# Patient Record
Sex: Female | Born: 1990 | Race: White | Hispanic: No | Marital: Married | Smoking: Never smoker
Health system: Southern US, Community
[De-identification: ages and names within clinical notes are randomized; demographics above are authoritative.]

## PROBLEM LIST (undated history)

## (undated) ENCOUNTER — Inpatient Hospital Stay (HOSPITAL_COMMUNITY): Payer: Self-pay

## (undated) ENCOUNTER — Emergency Department (HOSPITAL_BASED_OUTPATIENT_CLINIC_OR_DEPARTMENT_OTHER): Admission: EM | Payer: Medicaid Other

## (undated) DIAGNOSIS — F32A Depression, unspecified: Secondary | ICD-10-CM

## (undated) DIAGNOSIS — J40 Bronchitis, not specified as acute or chronic: Secondary | ICD-10-CM

## (undated) DIAGNOSIS — R519 Headache, unspecified: Secondary | ICD-10-CM

## (undated) DIAGNOSIS — J45909 Unspecified asthma, uncomplicated: Secondary | ICD-10-CM

## (undated) DIAGNOSIS — F329 Major depressive disorder, single episode, unspecified: Secondary | ICD-10-CM

## (undated) DIAGNOSIS — R51 Headache: Secondary | ICD-10-CM

## (undated) DIAGNOSIS — Z86718 Personal history of other venous thrombosis and embolism: Secondary | ICD-10-CM

## (undated) DIAGNOSIS — E162 Hypoglycemia, unspecified: Secondary | ICD-10-CM

## (undated) HISTORY — PX: CHOLECYSTECTOMY: SHX55

## (undated) HISTORY — DX: Personal history of other venous thrombosis and embolism: Z86.718

---
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
## 2012-07-04 ENCOUNTER — Encounter (HOSPITAL_COMMUNITY): Payer: Self-pay | Admitting: *Deleted

## 2012-07-04 ENCOUNTER — Emergency Department (HOSPITAL_COMMUNITY)
Admission: EM | Admit: 2012-07-04 | Discharge: 2012-07-04 | Disposition: A | Discharge status: Home Health | Payer: Self-pay | Attending: Emergency Medicine | Admitting: Emergency Medicine

## 2012-07-04 DIAGNOSIS — M79609 Pain in unspecified limb: Secondary | ICD-10-CM

## 2012-07-04 DIAGNOSIS — M7989 Other specified soft tissue disorders: Secondary | ICD-10-CM

## 2012-07-04 HISTORY — DX: Hypoglycemia, unspecified: E16.2

## 2012-07-04 MED ORDER — NAPROXEN 500 MG PO TABS: 500.0000 mg | ORAL_TABLET | Freq: Two times a day (BID) | ORAL | Status: AC

## 2012-07-04 NOTE — Progress Notes (Signed)
VASCULAR LAB PRELIMINARY  PRELIMINARY  PRELIMINARY  PRELIMINARY  Right upper extremity venous duplex completed.    Preliminary report: No evidence of deep or superficial vein thrombosis of the right upper extremity from the shoulder distally.  Becky Gomez, 07/04/2012, 5:45 PM

## 2012-07-04 NOTE — ED Notes (Addendum)
To ED for right arm pain and swelling since attempting to donate plasma. States there was a problem with the flow. Staff at donation center had to readjust the needle which caused infiltration and swelling. This was on Friday evening. Cap refill wnl

## 2012-07-04 NOTE — ED Provider Notes (Signed)
History  This chart was scribed for Becky Jakes, MD by Shari Heritage. The patient was seen in room TR06C/TR06C. Patient's care was started at 1309.     CSN: [id number]  Arrival date & time 07/04/12  1309   First MD Initiated Contact with Patient 07/04/12 [phone]      Chief Complaint  Patient presents with  . Arm Swelling    (Consider location/radiation/quality/duration/timing/severity/associated sxs/prior treatment) Patient is a 21 y.o. female presenting with extremity pain.  Extremity Pain This is a new problem. The current episode started more than 2 days ago. The problem occurs constantly. The problem has not changed since onset.Pertinent negatives include no chest pain, no abdominal pain, no headaches and no shortness of breath.    Becky Gomez is a 21 y.o. female who presents to the Emergency Department complaining of mild to moderate pain and swelling of the right upper extremity onset Friday evening (4 days ago) after patient tried to give a plasma donation. Patient says that the process was ultimately unsuccessful due to infiltration of blood into soft tissues and swelling. Patient denies other symptoms at this time. Patient has a medical history of hypoglycemia. She has never smoked.  Past Medical History  Diagnosis Date  . Hypoglycemia     History reviewed. No pertinent past surgical history.  No family history on file.  History  Substance Use Topics  . Smoking status: Never Smoker   . Smokeless tobacco: Not on file  . Alcohol Use: Yes    OB History    Grav Para Term Preterm Abortions TAB SAB Ect Mult Living                  Review of Systems  Respiratory: Negative for shortness of breath.   Cardiovascular: Negative for chest pain.  Gastrointestinal: Negative for abdominal pain.  Musculoskeletal:       Positive for right arm swelling, right arm pain.  Neurological: Negative for headaches.    Allergies  Penicillins  Home Medications   Current  Outpatient Rx  Name Route Sig Dispense Refill  . NUVARING VA Vaginal Place 1 Units vaginally every 28 (twenty-eight) days.    Marland Kitchen NAPROXEN 500 MG PO TABS Oral Take 1 tablet (500 mg total) by mouth 2 (two) times daily. 14 tablet 0    BP 111/68  Pulse 86  Temp 98 F (36.7 C) (Oral)  Resp 16  SpO2 98%  Physical Exam  Constitutional: She is oriented to person, place, and time. She appears well-developed and well-nourished.  HENT:  Head: Normocephalic and atraumatic.  Cardiovascular: Normal rate and regular rhythm.   No murmur heard. Pulses:      Radial pulses are 1+ on the right side.       Right radial pulse is 1+.  Pulmonary/Chest: Effort normal and breath sounds normal.  Abdominal: Soft. Bowel sounds are normal. There is no tenderness.  Musculoskeletal:       Linear 5 cm bruise to right antecubital area.  Neurological: She is alert and oriented to person, place, and time.  Skin:       Cap refill is 1 sec in right fingers.  Psychiatric: She has a normal mood and affect. Her behavior is normal.    ED Course  Procedures (including critical care time) DIAGNOSTIC STUDIES: Oxygen Saturation is 94% on room air, adequate by my interpretation.    COORDINATION OF CARE: 3:23pm- Patient informed of current plan for treatment and evaluation and agrees with plan at  this time.   Doppler study of the right upper extremity without evidence of a deep vein thrombosis.  Labs Reviewed - No data to display No results found.   1. Arm swelling       MDM  Doppler study of the right upper trimming without evidence of a DVT. Swelling is most likely due to to infiltration of of red blood cells or plasma during the plasma donation process. No evidence of infection. Patient will continue to elevate her right arm and would treat with anti-inflammatories. Patient will return for any new or worse symptoms.      I personally performed the services described in this documentation, which was  scribed in my presence. The recorded information has been reviewed and considered.     Becky Jakes, MD 07/04/12 [phone]

## 2014-10-02 ENCOUNTER — Other Ambulatory Visit: Payer: Self-pay | Admitting: Family Medicine

## 2014-10-02 DIAGNOSIS — R1013 Epigastric pain: Secondary | ICD-10-CM

## 2014-10-09 ENCOUNTER — Other Ambulatory Visit: Payer: Self-pay | Admitting: Family Medicine

## 2014-10-09 ENCOUNTER — Ambulatory Visit
Admission: RE | Admit: 2014-10-09 | Discharge: 2014-10-09 | Disposition: A | Discharge status: Home / Self Care | Payer: BC Managed Care – PPO | Source: Ambulatory Visit | Attending: Family Medicine | Admitting: Family Medicine

## 2014-10-09 DIAGNOSIS — R1013 Epigastric pain: Secondary | ICD-10-CM

## 2017-01-14 ENCOUNTER — Other Ambulatory Visit: Payer: Self-pay | Admitting: Physician Assistant

## 2017-01-14 DIAGNOSIS — R109 Unspecified abdominal pain: Secondary | ICD-10-CM

## 2017-01-21 ENCOUNTER — Emergency Department (HOSPITAL_COMMUNITY)
Admission: EM | Admit: 2017-01-21 | Discharge: 2017-01-21 | Disposition: A | Discharge status: Home / Self Care | Payer: 59 | Attending: Emergency Medicine | Admitting: Emergency Medicine

## 2017-01-21 ENCOUNTER — Encounter (HOSPITAL_COMMUNITY): Payer: Self-pay | Admitting: Emergency Medicine

## 2017-01-21 DIAGNOSIS — Z3A08 8 weeks gestation of pregnancy: Secondary | ICD-10-CM | POA: Insufficient documentation

## 2017-01-21 DIAGNOSIS — O99511 Diseases of the respiratory system complicating pregnancy, first trimester: Secondary | ICD-10-CM | POA: Insufficient documentation

## 2017-01-21 DIAGNOSIS — J069 Acute upper respiratory infection, unspecified: Secondary | ICD-10-CM | POA: Insufficient documentation

## 2017-01-21 DIAGNOSIS — J40 Bronchitis, not specified as acute or chronic: Secondary | ICD-10-CM | POA: Diagnosis not present

## 2017-01-21 DIAGNOSIS — Z3A01 Less than 8 weeks gestation of pregnancy: Secondary | ICD-10-CM

## 2017-01-21 MED ORDER — IPRATROPIUM BROMIDE 0.02 % IN SOLN
0.5000 mg | Freq: Once | RESPIRATORY_TRACT | Status: AC
  Administered 2017-01-21: 0.5 mg via RESPIRATORY_TRACT
  Filled 2017-01-21: qty 2.5

## 2017-01-21 MED ORDER — ALBUTEROL SULFATE HFA 108 (90 BASE) MCG/ACT IN AERS
2.0000 | INHALATION_SPRAY | RESPIRATORY_TRACT | Status: DC | PRN
  Administered 2017-01-21: 2 via RESPIRATORY_TRACT
  Filled 2017-01-21: qty 6.7

## 2017-01-21 MED ORDER — ALBUTEROL SULFATE (2.5 MG/3ML) 0.083% IN NEBU
5.0000 mg | INHALATION_SOLUTION | Freq: Once | RESPIRATORY_TRACT | Status: AC
  Administered 2017-01-21: 5 mg via RESPIRATORY_TRACT
  Filled 2017-01-21: qty 6

## 2017-01-21 MED ORDER — AEROCHAMBER Z-STAT PLUS/MEDIUM MISC
1.0000 | Freq: Once | Status: AC
  Administered 2017-01-21: 1
  Filled 2017-01-21: qty 1

## 2017-01-21 NOTE — ED Triage Notes (Signed)
Patient is [redacted] week pregnant. Patient started was feeling yucky, congested that started yesterday. Patient states last she was wheezing last night and could not get comfortable. Patient states she is not feeling any better today. She has been sneezing since last Friday.

## 2017-01-21 NOTE — Discharge Instructions (Signed)
Use inhaler 2 puffs every 3-4 hours as needed for cough or trouble breathing.  Use Tylenol as needed for fever.  You can consider stopping the Tamiflu as it does cause nausea, abdominal cramping and you did not appear to have influenza at this time.

## 2017-01-21 NOTE — ED Provider Notes (Addendum)
[facility]-EMERGENCY DEPT Provider Note   CSN: [id number] Arrival date & time: 01/21/17  1951     History   Chief Complaint Chief Complaint  Patient presents with  . Nasal Congestion  . sneezing  . Nausea  . Headache  . Cough    HPI Becky Gomez is a 26 y.o. female.  She presents for evaluation of wheezing, with nonproductive cough, present for 2 days.  She called her PCP today and they phoned in a prescription for Tamiflu, she has taken a single dose.  He denies fever, vomiting, weakness or dizziness.  She is reported to be 3 months pregnant.  She has ongoing nausea associated with an early pregnancy.  She does not have chronic asthma symptoms, but has had bronchitis in the past.  She is not a smoker.  There are no other known modifying factors.  HPI  Past Medical History:  Diagnosis Date  . Hypoglycemia     There are no active problems to display for this patient.   Past Surgical History:  Procedure Laterality Date  . CHOLECYSTECTOMY      OB History    Gravida Para Term Preterm AB Living   2 1 1          SAB TAB Ectopic Multiple Live Births                   Home Medications    Prior to Admission medications   Medication Sig Start Date End Date Taking? Authorizing Provider  oseltamivir (TAMIFLU) 75 MG capsule Take 75 mg by mouth 2 (two) times daily.   Yes Historical Provider, MD  Etonogestrel-Ethinyl Estradiol (NUVARING VA) Place 1 Units vaginally every 28 (twenty-eight) days.    Historical Provider, MD    Family History History reviewed. No pertinent family history.  Social History Social History  Substance Use Topics  . Smoking status: Never Smoker  . Smokeless tobacco: Never Used  . Alcohol use No     Allergies   Penicillins   Review of Systems Review of Systems   Physical Exam Updated Vital Signs BP 114/87 (BP Location: Left Arm)   Pulse (!) 122   Temp 98.1 F (36.7 C) (Oral)   Resp 18   Ht 5\' 6"  (1.676 m)   Wt 217 lb (98.4 kg)    SpO2 97%   BMI 35.02 kg/m   Physical Exam  Constitutional: She is oriented to person, place, and time. She appears well-developed. No distress.  Overweight  HENT:  Head: Normocephalic and atraumatic.  Eyes: Conjunctivae and EOM are normal. Pupils are equal, round, and reactive to light.  Neck: Normal range of motion and phonation normal. Neck supple.  Cardiovascular: Normal rate and regular rhythm.   Pulmonary/Chest: Effort normal. No respiratory distress. She has wheezes. She exhibits no tenderness.  Decreased expiratory air movement bilaterally with generalized wheezing.  No rales or rhonchi.  Abdominal: Soft. She exhibits no distension. There is no tenderness. There is no guarding.  Musculoskeletal: Normal range of motion.  Neurological: She is alert and oriented to person, place, and time. She exhibits normal muscle tone.  Skin: Skin is warm and dry.  Psychiatric: She has a normal mood and affect. Her behavior is normal. Judgment and thought content normal.  Nursing note and vitals reviewed.    ED Treatments / Results  Labs (all labs ordered are listed, but only abnormal results are displayed) Labs Reviewed - No data to display  EKG  EKG Interpretation None  Radiology No results found.  Procedures Procedures (including critical care time)  Medications Ordered in ED Medications  albuterol (PROVENTIL HFA;VENTOLIN HFA) 108 (90 Base) MCG/ACT inhaler 2 puff (not administered)  aerochamber Z-Stat Plus/medium 1 each (not administered)  albuterol (PROVENTIL) (2.5 MG/3ML) 0.083% nebulizer solution 5 mg (5 mg Nebulization Given 01/21/17 2158)  ipratropium (ATROVENT) nebulizer solution 0.5 mg (0.5 mg Nebulization Given 01/21/17 2158)     Initial Impression / Assessment and Plan / ED Course  I have reviewed the triage vital signs and the nursing notes.  Pertinent labs & imaging results that were available during my care of the patient were reviewed by me and  considered in my medical decision making (see chart for details).     Medications  albuterol (PROVENTIL HFA;VENTOLIN HFA) 108 (90 Base) MCG/ACT inhaler 2 puff (not administered)  aerochamber Z-Stat Plus/medium 1 each (not administered)  albuterol (PROVENTIL) (2.5 MG/3ML) 0.083% nebulizer solution 5 mg (5 mg Nebulization Given 01/21/17 2158)  ipratropium (ATROVENT) nebulizer solution 0.5 mg (0.5 mg Nebulization Given 01/21/17 2158)    Patient Vitals for the past 24 hrs:  BP Temp Temp src Pulse Resp SpO2 Height Weight  01/21/17 2158 - - - - - 97 % - -  01/21/17 1952 114/87 98.1 F (36.7 C) Oral (!) 122 18 96 % 5\' 6"  (1.676 m) 217 lb (98.4 kg)    11:26 PM Reevaluation with update and discussion. After initial assessment and treatment, an updated evaluation reveals patient states she feels better and notices less wheezing.  Lungs have improved air movement with very few scattered wheezes.  Findings discussed with patient and husband, all questions answered. Becky Gomez L    Final Clinical Impressions(s) / ED Diagnoses   Final diagnoses:  Bronchitis  Viral upper respiratory tract infection  Less than [redacted] weeks gestation of pregnancy    Evaluation is consistent with bronchitis likely viral mediated.  Doubt influenza, serious bacterial infection, pneumonia or metabolic instability.  Discussed continuing Tamiflu versus stopping because it does not appear that she has influenza.  Patient is not febrile.  Does not have any significant signs or symptoms of seasonal influenza.   Nursing Notes Reviewed/ Care Coordinated Applicable Imaging Reviewed Interpretation of Laboratory Data incorporated into ED treatment  The patient appears reasonably screened and/or stabilized for discharge and I doubt any other medical condition or other [employer] requiring further screening, evaluation, or treatment in the ED at this time prior to discharge.  Plan: Home Medications-continue usual, albuterol inhaler with  spacer to use every 4 as needed; Home Treatments-rest, fluids; return here if the recommended treatment, does not improve the symptoms; Recommended follow up-PCP, as needed    New Prescriptions New Prescriptions   No medications on file     Mancel BaleElliott Mckenleigh Tarlton, MD 01/21/17 [id number]    Mancel BaleElliott Mizraim Harmening, MD 01/21/17 [phone]

## 2018-12-01 ENCOUNTER — Encounter (HOSPITAL_COMMUNITY): Payer: Self-pay | Admitting: *Deleted

## 2018-12-01 ENCOUNTER — Inpatient Hospital Stay (HOSPITAL_COMMUNITY)
Admission: AD | Admit: 2018-12-01 | Discharge: 2018-12-02 | Disposition: A | Discharge status: Home / Self Care | Payer: Medicaid Other | Source: Ambulatory Visit | Attending: Obstetrics and Gynecology | Admitting: Obstetrics and Gynecology

## 2018-12-01 DIAGNOSIS — J029 Acute pharyngitis, unspecified: Secondary | ICD-10-CM

## 2018-12-01 DIAGNOSIS — J069 Acute upper respiratory infection, unspecified: Secondary | ICD-10-CM | POA: Diagnosis not present

## 2018-12-01 DIAGNOSIS — Z3A13 13 weeks gestation of pregnancy: Secondary | ICD-10-CM

## 2018-12-01 DIAGNOSIS — R102 Pelvic and perineal pain: Secondary | ICD-10-CM

## 2018-12-01 DIAGNOSIS — R109 Unspecified abdominal pain: Secondary | ICD-10-CM | POA: Diagnosis present

## 2018-12-01 DIAGNOSIS — O26891 Other specified pregnancy related conditions, first trimester: Secondary | ICD-10-CM

## 2018-12-01 DIAGNOSIS — Z880 Allergy status to penicillin: Secondary | ICD-10-CM | POA: Insufficient documentation

## 2018-12-01 DIAGNOSIS — O99511 Diseases of the respiratory system complicating pregnancy, first trimester: Secondary | ICD-10-CM | POA: Insufficient documentation

## 2018-12-01 DIAGNOSIS — O26899 Other specified pregnancy related conditions, unspecified trimester: Secondary | ICD-10-CM

## 2018-12-01 HISTORY — DX: Depression, unspecified: F32.A

## 2018-12-01 HISTORY — DX: Unspecified asthma, uncomplicated: J45.909

## 2018-12-01 HISTORY — DX: Major depressive disorder, single episode, unspecified: F32.9

## 2018-12-01 HISTORY — DX: Bronchitis, not specified as acute or chronic: J40

## 2018-12-01 HISTORY — DX: Headache: R51

## 2018-12-01 HISTORY — DX: Headache, unspecified: R51.9

## 2018-12-01 LAB — URINALYSIS, ROUTINE W REFLEX MICROSCOPIC
Bilirubin Urine: NEGATIVE
Glucose, UA: NEGATIVE mg/dL
Hgb urine dipstick: NEGATIVE
Ketones, ur: NEGATIVE mg/dL
Nitrite: NEGATIVE
Protein, ur: NEGATIVE mg/dL
Specific Gravity, Urine: 1.02 (ref 1.005–1.030)
pH: 6 (ref 5.0–8.0)

## 2018-12-01 LAB — INFLUENZA PANEL BY PCR (TYPE A & B)
Influenza A By PCR: NEGATIVE
Influenza B By PCR: NEGATIVE

## 2018-12-01 MED ORDER — ACETAMINOPHEN 325 MG PO TABS
650.0000 mg | ORAL_TABLET | Freq: Once | ORAL | Status: AC
  Administered 2018-12-01: 650 mg via ORAL
  Filled 2018-12-01: qty 2

## 2018-12-01 NOTE — MAU Provider Note (Signed)
History     CSN: [id number]  Arrival date and time: 12/01/18 2139   First Provider Initiated Contact with Patient 12/01/18 2240      Chief Complaint  Patient presents with  . Abdominal Pain  . Cough   Becky Gomez is a 28 y.o. G5P2 at 7410w1d who presents to MAU with complaints of cough, intermittent fever and abdominal pain. She reports cough and sore throat has been occurring for the past week, symptoms are associated with mucous and occasional wheezing after coughing for an extended period of time. She reports both children were diagnosed with strep throat and the flu last week, was given Tamiflu to prevent her from getting the flu- completed course this past Sunday. Denies currently having a fever or wheezing, has not taken any medication for cough or possible flu other than completing Tamiflu. She reports abdominal pain that started yesterday, reports abdominal pain as sharp aching pain in lower abdomen. Reports no pain when laying down or not moving and pain starts with movement, standing, or changing position in bed. Rates pain 5/10- has not taken any medication for abdominal pain. Denies vaginal bleeding or discharge. [facility] [place], which she has had a normal IUP on US.    OB History    Gravida  5   Para  2   Term  2   Preterm      AB  2   Living  2     SAB  2   TAB      Ectopic      Multiple      Live Births  1           Past Medical History:  Diagnosis Date  . Asthma   . Bronchitis   . Depression   . Headache   . Hypoglycemia     Past Surgical History:  Procedure Laterality Date  . CHOLECYSTECTOMY      History reviewed. No pertinent family history.  Social History   Tobacco Use  . Smoking status: Never Smoker  . Smokeless tobacco: Never Used  Substance Use Topics  . Alcohol use: No  . Drug use: No    Allergies:  Allergies  Allergen Reactions  . Penicillins Nausea And Vomiting    Has patient  had a PCN reaction causing immediate rash, facial/tongue/throat swelling, SOB or lightheadedness with hypotension: no Has patient had a PCN reaction causing severe rash involving mucus membranes or skin necrosis: no Has patient had a PCN reaction that required hospitalization: no Has patient had a PCN reaction occurring within the last 10 years: no If all of the above answers are "NO", then may proceed with Cephalosporin use.     Medications Prior to Admission  Medication Sig Dispense Refill Last Dose  . Prenatal Vit-Fe Fumarate-FA (PRENATAL MULTIVITAMIN) TABS tablet Take 1 tablet by mouth daily at 12 noon.     . sertraline (ZOLOFT) 25 MG tablet Take 25 mg by mouth daily.   12/01/2018 at Unknown time  . Etonogestrel-Ethinyl Estradiol (NUVARING VA) Place 1 Units vaginally every 28 (twenty-eight) days.   07/04/2012 at Unknown  . oseltamivir (TAMIFLU) 75 MG capsule Take 75 mg by mouth 2 (two) times daily.   01/21/2017 at 1800    Review of Systems  Constitutional: Positive for fever. Negative for chills.       Intermittent fever  HENT: Positive for sore throat.   Respiratory: Positive for cough and wheezing. Negative for shortness of  breath.        No wheezing currently   Cardiovascular: Negative.   Gastrointestinal: Positive for abdominal pain. Negative for constipation, diarrhea, nausea and vomiting.  Genitourinary: Negative.   Musculoskeletal: Negative.    Physical Exam   Patient Vitals for the past 24 hrs:  BP Temp Pulse Resp SpO2 Height Weight  12/01/18 2310 (!) 104/58 98.3 F (36.8 C) 74 18 - - -  12/01/18 2152 113/64 - 89 - 99 % - -  12/01/18 2147 - 98.2 F (36.8 C) - 18 - 5\' 6"  (1.676 m) 96.2 kg   Physical Exam  Nursing note and vitals reviewed. Constitutional: She is oriented to person, place, and time. She appears well-developed and well-nourished. No distress.  Cardiovascular: Normal rate, regular rhythm and normal heart sounds.  Respiratory: Effort normal and breath sounds  normal. No respiratory distress. She has no wheezes. She has no rales.  GI: Soft. She exhibits no distension. There is abdominal tenderness. There is no rebound and no guarding.  Lower pelvis and left abdomen  Musculoskeletal: Normal range of motion.        General: No edema.  Neurological: She is alert and oriented to person, place, and time.  Psychiatric: She has a normal mood and affect. Her behavior is normal. Thought content normal.   FHR 155 by doppler   Dilation: Closed Effacement (%): Thick Cervical Position: Posterior Exam by:: Steward Drone CNM  MAU Course  Procedures  MDM Orders Placed This Encounter  Procedures  . Culture, OB Urine  . Group A Strep by PCR  . Urinalysis, Routine w reflex microscopic  . Influenza panel by PCR (type A & B)   Results for orders placed or performed during the hospital encounter of 12/01/18 (from the past 24 hour(s))  Influenza panel by PCR (type A & B)     Status: None   Collection Time: 12/01/18 10:00 PM  Result Value Ref Range   Influenza A By PCR NEGATIVE NEGATIVE   Influenza B By PCR NEGATIVE NEGATIVE  Group A Strep by PCR     Status: None   Collection Time: 12/01/18 10:00 PM  Result Value Ref Range   Group A Strep by PCR NOT DETECTED NOT DETECTED  Urinalysis, Routine w reflex microscopic     Status: Abnormal   Collection Time: 12/01/18 10:15 PM  Result Value Ref Range   Color, Urine YELLOW YELLOW   APPearance HAZY (A) CLEAR   Specific Gravity, Urine 1.020 1.005 - 1.030   pH 6.0 5.0 - 8.0   Glucose, UA NEGATIVE NEGATIVE mg/dL   Hgb urine dipstick NEGATIVE NEGATIVE   Bilirubin Urine NEGATIVE NEGATIVE   Ketones, ur NEGATIVE NEGATIVE mg/dL   Protein, ur NEGATIVE NEGATIVE mg/dL   Nitrite NEGATIVE NEGATIVE   Leukocytes, UA MODERATE (A) NEGATIVE   RBC / HPF 0-5 0 - 5 RBC/hpf   WBC, UA 0-5 0 - 5 WBC/hpf   Bacteria, UA RARE (A) NONE SEEN   Squamous Epithelial / LPF 0-5 0 - 5   Mucus PRESENT    Treatments in MAU included  Tylenol for abdominal pain. Patient reports pain is relieved with medication.   Discussed results with patient. Influenza negative, Strep pending- will call patient with positive results and manage accordingly. Educated and discussed safe medications to take during pregnancy for cough/congestion/sore throat. Discussed RLP and position changes/maernity support belt later in pregnancy. Discussed reasons to return to MAU. Follow up as scheduled for prenatal appointments. Pt stable at time  of discharge.    Assessment and Plan   1. URI with cough and congestion   2. Acute sore throat   3. [redacted] weeks gestation of pregnancy   4. Pain of round ligament during pregnancy    Discharge home Will call with results of pending labs if positive  Safe medications to take during pregnancy  Return to MAU as needed Follow up as scheduled for prenatal appointments  Maternity support belt later in pregnancy   Allergies as of 12/02/2018      Reactions   Penicillins Nausea And Vomiting   Has patient had a PCN reaction causing immediate rash, facial/tongue/throat swelling, SOB or lightheadedness with hypotension: no Has patient had a PCN reaction causing severe rash involving mucus membranes or skin necrosis: no Has patient had a PCN reaction that required hospitalization: no Has patient had a PCN reaction occurring within the last 10 years: no If all of the above answers are "NO", then may proceed with Cephalosporin use.      Medication List    STOP taking these medications   NUVARING VA   oseltamivir 75 MG capsule Commonly known as:  TAMIFLU     TAKE these medications   prenatal multivitamin Tabs tablet Take 1 tablet by mouth daily at 12 noon.   sertraline 25 MG tablet Commonly known as:  ZOLOFT Take 25 mg by mouth daily.       Sharyon Cable CNM 12/01/2018, 11:06 PM

## 2018-12-01 NOTE — MAU Note (Addendum)
"  Both of my kids have had flu and strep throat" Tonight I have just not felt well. Tonight had lower abd pain. Have sore throat and cough. Wheezing. Off and on temp over a wk. Sometimes coughs up mucous. Sometimes red specs in my urine when I pee

## 2018-12-02 LAB — GROUP A STREP BY PCR: Group A Strep by PCR: NOT DETECTED

## 2018-12-02 NOTE — Progress Notes (Signed)
Veronica Rogers CNM in earlier to discuss test results and d/c plan. Written and verbal d/c instructions given and understanding voiced. 

## 2018-12-03 LAB — CULTURE, OB URINE

## 2018-12-30 ENCOUNTER — Encounter (HOSPITAL_COMMUNITY): Payer: Self-pay

## 2018-12-30 ENCOUNTER — Other Ambulatory Visit: Payer: Self-pay

## 2018-12-30 ENCOUNTER — Inpatient Hospital Stay (HOSPITAL_BASED_OUTPATIENT_CLINIC_OR_DEPARTMENT_OTHER): Payer: Medicaid Other

## 2018-12-30 ENCOUNTER — Inpatient Hospital Stay (HOSPITAL_COMMUNITY)
Admission: AD | Admit: 2018-12-30 | Discharge: 2018-12-30 | Disposition: A | Discharge status: Home / Self Care | Payer: Medicaid Other | Source: Ambulatory Visit | Attending: Obstetrics and Gynecology | Admitting: Obstetrics and Gynecology

## 2018-12-30 DIAGNOSIS — O4692 Antepartum hemorrhage, unspecified, second trimester: Secondary | ICD-10-CM

## 2018-12-30 DIAGNOSIS — Z3A17 17 weeks gestation of pregnancy: Secondary | ICD-10-CM

## 2018-12-30 DIAGNOSIS — O209 Hemorrhage in early pregnancy, unspecified: Secondary | ICD-10-CM | POA: Diagnosis not present

## 2018-12-30 DIAGNOSIS — Z880 Allergy status to penicillin: Secondary | ICD-10-CM | POA: Insufficient documentation

## 2018-12-30 DIAGNOSIS — O9989 Other specified diseases and conditions complicating pregnancy, childbirth and the puerperium: Secondary | ICD-10-CM | POA: Diagnosis not present

## 2018-12-30 DIAGNOSIS — O4690 Antepartum hemorrhage, unspecified, unspecified trimester: Secondary | ICD-10-CM

## 2018-12-30 LAB — URINALYSIS, MICROSCOPIC (REFLEX)

## 2018-12-30 LAB — URINALYSIS, ROUTINE W REFLEX MICROSCOPIC
Bilirubin Urine: NEGATIVE
Glucose, UA: NEGATIVE mg/dL
Ketones, ur: 15 mg/dL — AB
Leukocytes, UA: NEGATIVE
Nitrite: NEGATIVE
Protein, ur: NEGATIVE mg/dL
SPECIFIC GRAVITY, URINE: 1.02 (ref 1.005–1.030)
pH: 6.5 (ref 5.0–8.0)

## 2018-12-30 NOTE — MAU Note (Signed)
Pt. States she noticed some moderate bleeding around 3pm today after having intercourse.  Pt states blood filled up toilet, but has "eased up" over the last hour.  Pt also reports some lower abd, pain that she rates 4/10.  FHR 150

## 2018-12-30 NOTE — MAU Provider Note (Signed)
History     CSN: [id number]  Arrival date and time: 12/30/18 1534   First Provider Initiated Contact with Patient 12/30/18 1634      Chief Complaint  Patient presents with  . Vaginal Bleeding  . Abdominal Pain   HPI Becky Gomez is a 28 y.o. [phone] at 7535w2d who presents with bleeding after intercourse. She states she noticed bleeding in the toilet after intercourse and felt like it was more than it should be. She denies any pain.   OB History    Gravida  5   Para  2   Term  2   Preterm      AB  2   Living  2     SAB  2   TAB      Ectopic      Multiple      Live Births  1           Past Medical History:  Diagnosis Date  . Asthma   . Bronchitis   . Depression   . Headache   . Hypoglycemia     Past Surgical History:  Procedure Laterality Date  . CHOLECYSTECTOMY      History reviewed. No pertinent family history.  Social History   Tobacco Use  . Smoking status: Never Smoker  . Smokeless tobacco: Never Used  Substance Use Topics  . Alcohol use: No  . Drug use: No    Allergies:  Allergies  Allergen Reactions  . Penicillins Nausea And Vomiting    Has patient had a PCN reaction causing immediate rash, facial/tongue/throat swelling, SOB or lightheadedness with hypotension: no Has patient had a PCN reaction causing severe rash involving mucus membranes or skin necrosis: no Has patient had a PCN reaction that required hospitalization: no Has patient had a PCN reaction occurring within the last 10 years: no If all of the above answers are "NO", then may proceed with Cephalosporin use.     Medications Prior to Admission  Medication Sig Dispense Refill Last Dose  . Prenatal Vit-Fe Fumarate-FA (PRENATAL MULTIVITAMIN) TABS tablet Take 1 tablet by mouth daily at 12 noon.     . sertraline (ZOLOFT) 25 MG tablet Take 25 mg by mouth daily.   12/01/2018 at Unknown time    Review of Systems  Constitutional: Negative.  Negative for fatigue and  fever.  HENT: Negative.   Respiratory: Negative.  Negative for shortness of breath.   Cardiovascular: Negative.  Negative for chest pain.  Gastrointestinal: Negative.  Negative for abdominal pain, constipation, diarrhea, nausea and vomiting.  Genitourinary: Positive for vaginal bleeding. Negative for dysuria.  Neurological: Negative.  Negative for dizziness and headaches.   Physical Exam   Blood pressure 105/62, pulse 83, temperature 98.5 F (36.9 C), temperature source Oral, resp. rate 18, SpO2 100 %.  Physical Exam  Nursing note and vitals reviewed. Constitutional: She is oriented to person, place, and time. She appears well-developed and well-nourished. No distress.  HENT:  Head: Normocephalic.  Eyes: Pupils are equal, round, and reactive to light.  Cardiovascular: Normal rate, regular rhythm and normal heart sounds.  Respiratory: Effort normal and breath sounds normal. No respiratory distress.  GI: Soft. Bowel sounds are normal. She exhibits no distension. There is no abdominal tenderness.  Genitourinary:    Genitourinary Comments: Small amount of watery pink discharge on speculum exam   Neurological: She is alert and oriented to person, place, and time.  Skin: Skin is warm and dry.  Psychiatric: She has  a normal mood and affect. Her behavior is normal. Judgment and thought content normal.    MAU Course  Procedures Results for orders placed or performed during the hospital encounter of 12/30/18 (from the past 24 hour(s))  Urinalysis, Routine w reflex microscopic     Status: Abnormal   Collection Time: 12/30/18  5:47 PM  Result Value Ref Range   Color, Urine YELLOW YELLOW   APPearance HAZY (A) CLEAR   Specific Gravity, Urine 1.020 1.005 - 1.030   pH 6.5 5.0 - 8.0   Glucose, UA NEGATIVE NEGATIVE mg/dL   Hgb urine dipstick SMALL (A) NEGATIVE   Bilirubin Urine NEGATIVE NEGATIVE   Ketones, ur 15 (A) NEGATIVE mg/dL   Protein, ur NEGATIVE NEGATIVE mg/dL   Nitrite NEGATIVE  NEGATIVE   Leukocytes, UA NEGATIVE NEGATIVE  Urinalysis, Microscopic (reflex)     Status: Abnormal   Collection Time: 12/30/18  5:47 PM  Result Value Ref Range   RBC / HPF 0-5 0 - 5 RBC/hpf   WBC, UA 6-10 0 - 5 WBC/hpf   Bacteria, UA MANY (A) NONE SEEN   Squamous Epithelial / LPF 0-5 0 - 5   MDM UA Fern-negative [place] MFM Limited- cervix 3.86cm, normal fluid, no abruption or previa  Assessment and Plan   1. Vaginal bleeding during pregnancy   2. [redacted] weeks gestation of pregnancy    -Discharge home in stable condition -Pelvic rest precautions discussed -Patient advised to follow-up with OB in [place] as scheduled for prenatal care -Patient may return to MAU as needed or if her condition were to change or worsen   Rolm Bookbinder CNM 12/30/2018, 4:34 PM

## 2018-12-30 NOTE — Discharge Instructions (Signed)

## 2019-08-30 ENCOUNTER — Encounter (HOSPITAL_COMMUNITY): Payer: Self-pay

## 2019-10-22 ENCOUNTER — Other Ambulatory Visit: Payer: Self-pay

## 2019-10-22 DIAGNOSIS — Z20822 Contact with and (suspected) exposure to covid-19: Secondary | ICD-10-CM

## 2019-10-24 LAB — NOVEL CORONAVIRUS, NAA: SARS-CoV-2, NAA: NOT DETECTED

## 2020-09-10 IMAGING — US US MFM OB LIMITED
1 series · 15 of 16 positions shown · non-contrast
Comparison: none

[Series 1: us mfm ob limited · 15 of 16 slices shown]
[im 1/16]
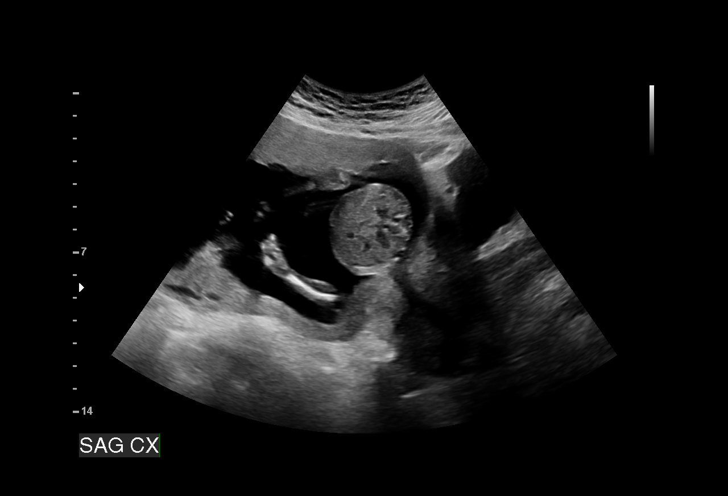
[im 2/16]
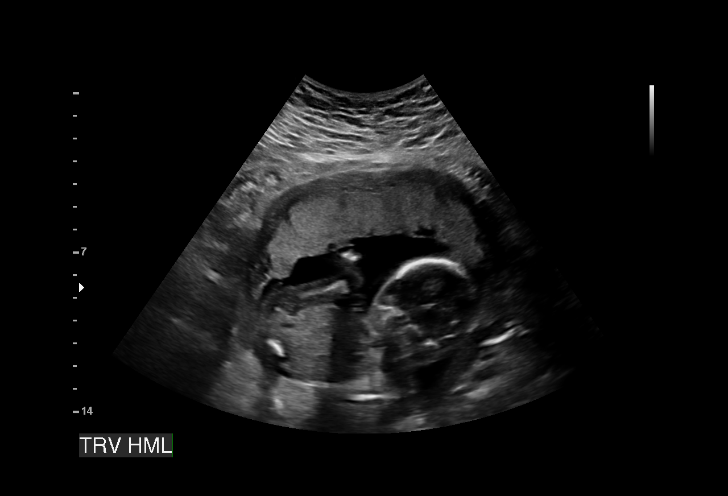
[im 3/16]
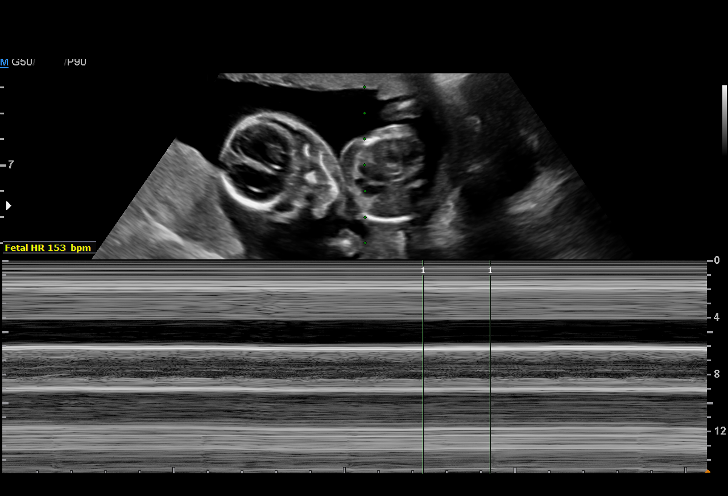
[im 4/16]
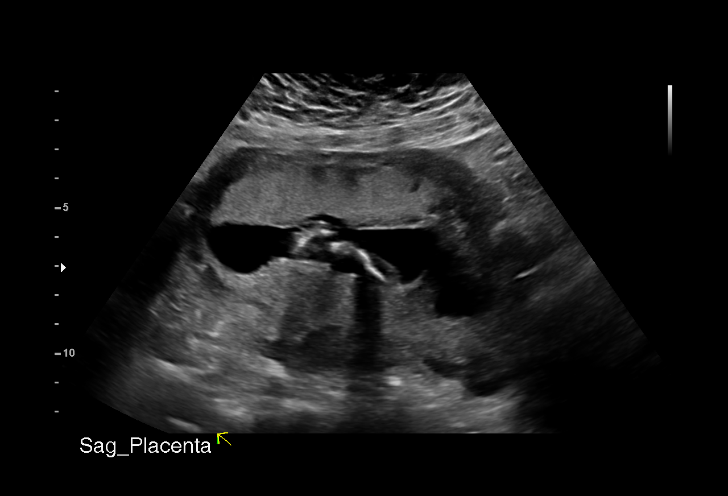
[im 5/16]
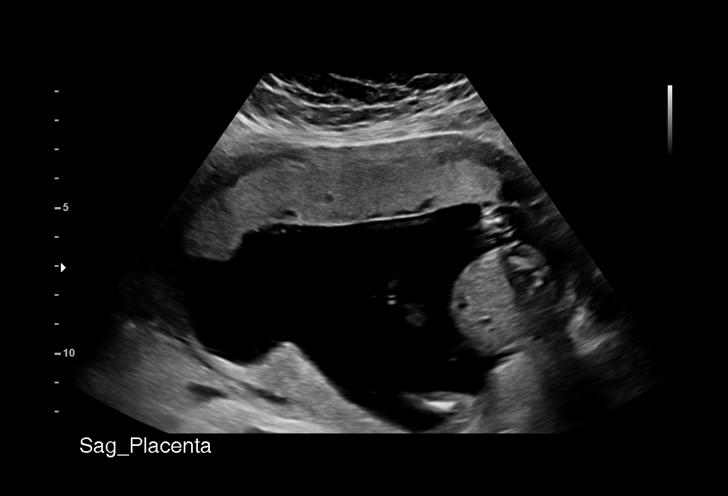
[im 6/16]
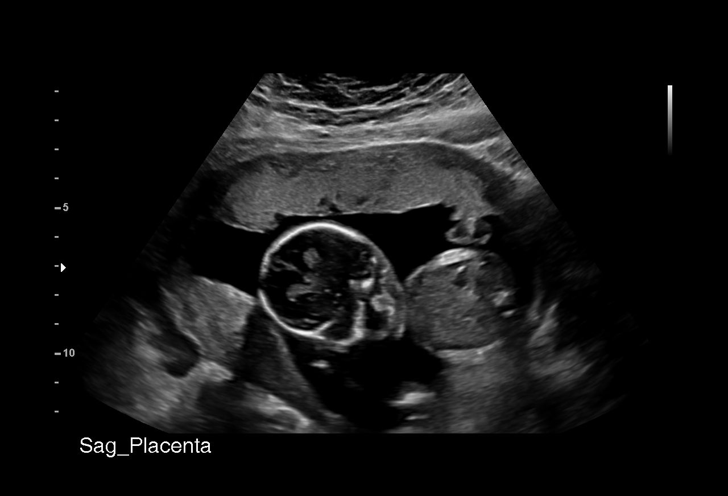
[im 7/16]
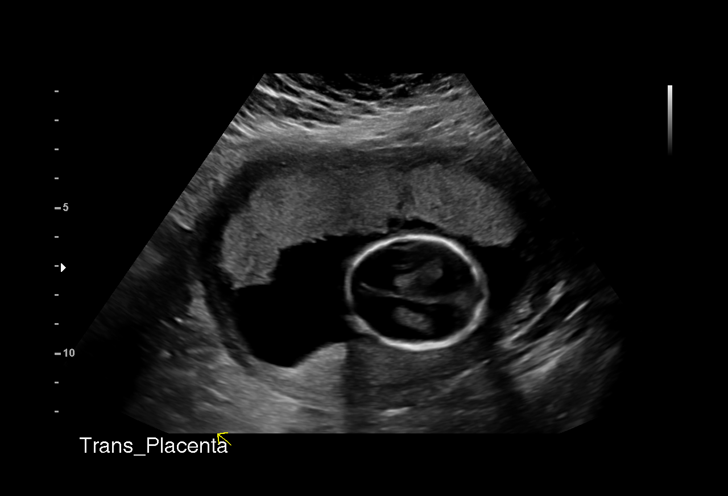
[im 9/16]
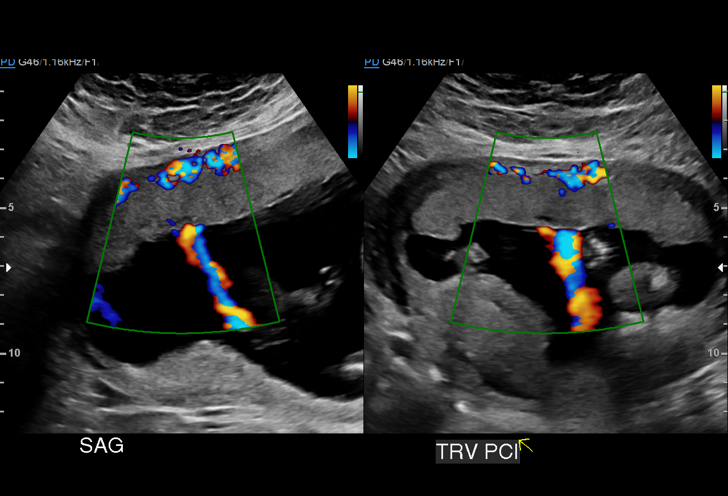
[im 10/16]
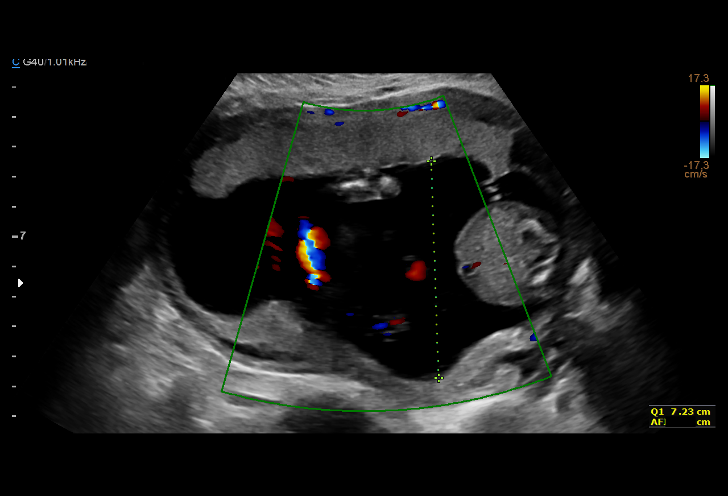
[im 11/16]
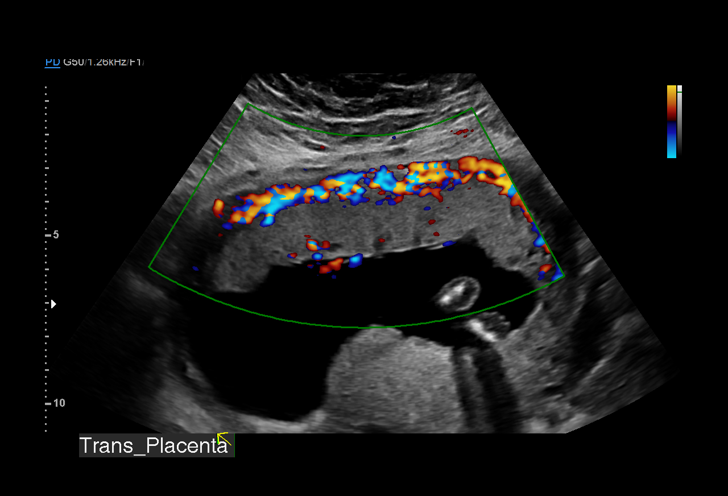
[im 12/16]
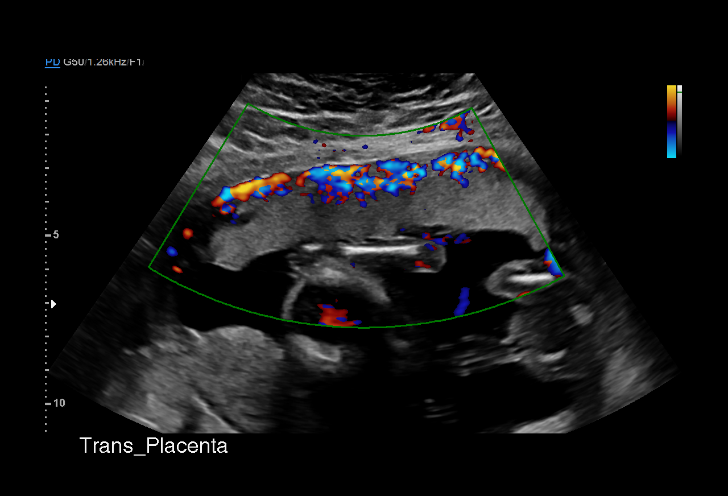
[im 13/16]
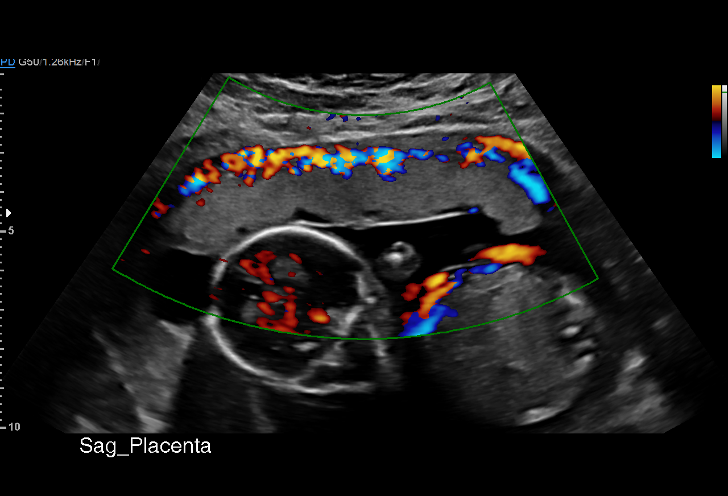
[im 14/16]
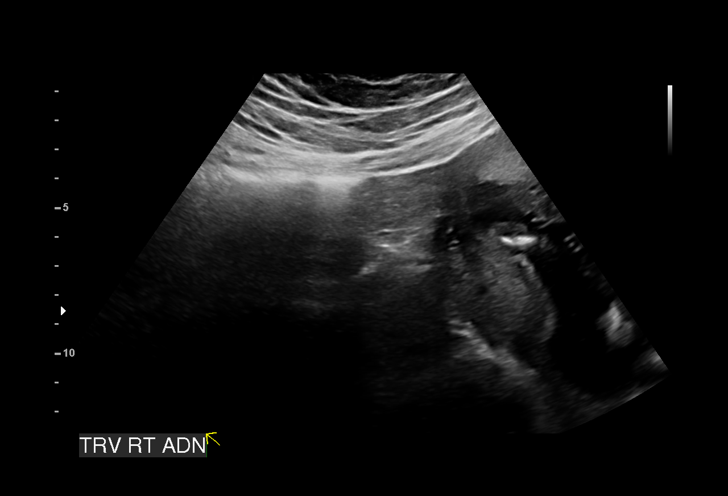
[im 15/16]
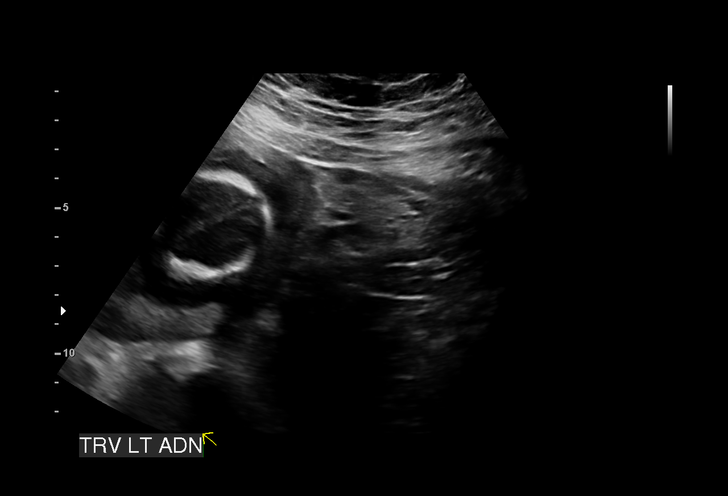
[im 16/16]
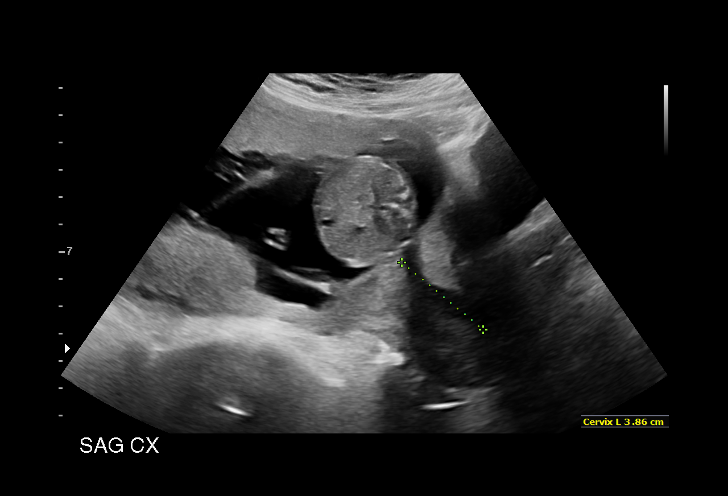

[15 of 16 positions shown; findings below may reference images not displayed]

Name:       CRUZDALYS BOLKANS               Visit Date: 12/30/2018 [DATE]

 Attending:        Giatas Paf       Secondary Phy.:    [facility]-
                                                             MAU/Triage

  1  US MFM OB LIMITED                     76815.01    CHURISTUTHEHAS AZMEE
 ----------------------------------------------------------------------

 ----------------------------------------------------------------------
Indications

  Vaginal bleeding in pregnancy, second
  trimester (Post Coital)
  17 weeks gestation of pregnancy
  Abdominal pain in pregnancy
 ----------------------------------------------------------------------
Fetal Evaluation

 Num Of Fetuses:          1
 Fetal Heart              153
 Rate(bpm):
 Cardiac Activity:        Observed
 Presentation:            Transverse, head to maternal left
 Placenta:                Anterior
 P. Cord Insertion:       Visualized

 Amniotic Fluid
 AFI FV:      Within normal limits

 Comment:    No placental abruption or previa identified sonographically.
OB History

 Gravidity:    5         Term:   2        Prem:   0         SAB:   2
 TOP:          0       Ectopic:  0        Living: 2
Gestational Age

 Clinical EDD:  17w 2d                                        EDD:    06/07/19
 Best:          17w 2d     Det. By:  Clinical EDD             EDD:    06/07/19
Cervix Uterus Adnexa
 Cervix
 Length:           3.86  cm.
 Normal appearance by transabdominal scan.

 Left Ovary
 Not visualized.

 Right Ovary
 Not visualized.

 Adnexa
 No abnormality visualized.
Impression

 Live mid-trimester gestation with normal placenta location(by
 transabdominal sonography) and normal amniotic fluid
 volume.
Recommendations

 Follow up as clinically indicated and or scheduled.

 Consider transvaginal sonography if symptoms of
 antepartum bleeding persists and or recurs.
                Buzy, Kaledzi

## 2022-01-21 ENCOUNTER — Ambulatory Visit (INDEPENDENT_AMBULATORY_CARE_PROVIDER_SITE_OTHER): Payer: Medicaid Other | Admitting: Psychiatry

## 2022-01-21 ENCOUNTER — Encounter (HOSPITAL_COMMUNITY): Payer: Self-pay | Admitting: Psychiatry

## 2022-01-21 ENCOUNTER — Other Ambulatory Visit: Payer: Self-pay

## 2022-01-21 VITALS — BP 118/66 | HR 62 | Ht 65.0 in | Wt 226.0 lb

## 2022-01-21 DIAGNOSIS — F331 Major depressive disorder, recurrent, moderate: Secondary | ICD-10-CM

## 2022-01-21 MED ORDER — MIRTAZAPINE 7.5 MG PO TABS: 7.5000 mg | ORAL_TABLET | Freq: Every day | ORAL | 2 refills | Status: DC

## 2022-01-21 MED ORDER — BUSPIRONE HCL 10 MG PO TABS: 10.0000 mg | ORAL_TABLET | Freq: Two times a day (BID) | ORAL | 2 refills | Status: AC
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
## 2022-01-21 NOTE — Patient Instructions (Signed)
Follow-up in 2 weeks

## 2022-02-04 ENCOUNTER — Other Ambulatory Visit: Payer: Self-pay

## 2022-02-04 ENCOUNTER — Ambulatory Visit (INDEPENDENT_AMBULATORY_CARE_PROVIDER_SITE_OTHER): Payer: Medicaid Other | Admitting: Psychiatry

## 2022-02-04 ENCOUNTER — Encounter (HOSPITAL_COMMUNITY): Payer: Self-pay | Admitting: Psychiatry

## 2022-02-04 VITALS — BP 121/79 | HR 79 | Ht 65.0 in | Wt 232.0 lb

## 2022-02-04 DIAGNOSIS — F331 Major depressive disorder, recurrent, moderate: Secondary | ICD-10-CM

## 2022-02-04 MED ORDER — MIRTAZAPINE 15 MG PO TABS: 15.0000 mg | ORAL_TABLET | Freq: Every day | ORAL | 0 refills | Status: DC

## 2022-02-04 NOTE — Patient Instructions (Signed)
Phone F/u in 1 week. ?

## 2022-02-04 NOTE — Progress Notes (Signed)
BH MD/PA/NP OP Progress Note ? ?02/04/2022 2:25 PM ?Becky Gomez  ?MRN:  [id number] ? ?Chief Complaint:  ?Chief Complaint  ?Patient presents with  ? Medication Management  ?  in person, f/u mm  ? ?HPI: Patient is a 31 year old female with psychiatric history of MDD, anxiety, postpartum depression and PTSD presented to [facility] behavioral health outpatient clinic for medication management. ? ?Patient seen for follow-up at [facility] outpatient clinic.  Patient reports improved mood and states she is able to experience joy in the last 2 weeks.  She reports feeling less depressed and anxious.  She reports that her sleep has improved a lot and she fall asleep at 9:30 PM at night.  She reports that when she wakes up she feels refreshed.  She reports that her appetite is also much better and she is eating her meals regularly.  She reports vivid dreams with people dying and shooting.  She states the dreams started when she started Remeron.  She states that the same time her gynecologist prescribed her very high dose of birth control to control her bleeding and she is not sure if the nightmares are caused by Remeron or birth control pills.  She states she has a history of PTSD and she used to get nightmares in the past but she was not getting any nightmares before she came for initial assessment 2 weeks ago.  She also reports some shaking and 1 episode of extreme irritability and anger after having an argument with her husband where she felt like exploding.  She states that she has not been taking her BuSpar consistently twice daily and mostly takes once daily in the morning.  Currently, patient denies suicidal ideation, homicidal ideation, auditory and visual hallucinations.   Discussed that she can skip Remeron dose for 1-2 nights to see if nightmares improve.  If nightmares does not get better then she can start taking high-dose of Remeron 15 mg nightly.  Discussed to continue taking BuSpar as prescribed twice daily  dose.  She verbalizes understanding. ? ?Visit Diagnosis:  ?  ICD-10-CM   ?1. MDD (major depressive disorder), recurrent episode, moderate (HCC)  F33.1 mirtazapine (REMERON) 15 MG tablet  ?  ? ? ?Past Psychiatric History: MDD, Anxiety and PTSD ?1 previous suicidal attempt by overdosing in 2009. ? ?Past Medical History:  ?Past Medical History:  ?Diagnosis Date  ? Asthma   ? Bronchitis   ? Depression   ? Headache   ? Hypoglycemia   ?  ?Past Surgical History:  ?Procedure Laterality Date  ? CHOLECYSTECTOMY    ? ? ?Family Psychiatric History: Dad - history of bipolar 1, drug and alcohol addiction ?No suicidal attempts in family. ? ?Family History: No family history on file. ? ?Social History:  ?Social History  ? ?Socioeconomic History  ? Marital status: Married  ?  Spouse name: Not on file  ? Number of children: Not on file  ? Years of education: Not on file  ? Highest education level: Not on file  ?Occupational History  ? Not on file  ?Tobacco Use  ? Smoking status: Never  ? Smokeless tobacco: Never  ?Substance and Sexual Activity  ? Alcohol use: No  ? Drug use: No  ? Sexual activity: Yes  ?Other Topics Concern  ? Not on file  ?Social History Narrative  ? Not on file  ? ?Social Determinants of Health  ? ?Financial Resource Strain: Not on file  ?Food Insecurity: Not on file  ?Transportation Needs: Not  on file  ?Physical Activity: Not on file  ?Stress: Not on file  ?Social Connections: Not on file  ? ? ?Allergies:  ?Allergies  ?Allergen Reactions  ? Penicillins Nausea And Vomiting  ?  Has patient had a PCN reaction causing immediate rash, facial/tongue/throat swelling, SOB or lightheadedness with hypotension: no ?Has patient had a PCN reaction causing severe rash involving mucus membranes or skin necrosis: no ?Has patient had a PCN reaction that required hospitalization: no ?Has patient had a PCN reaction occurring within the last 10 years: no ?If all of the above answers are "NO", then may proceed with Cephalosporin  use. ?  ? ? ?Metabolic Disorder Labs: ?No results found for: HGBA1C, MPG ?No results found for: PROLACTIN ?No results found for: CHOL, TRIG, HDL, CHOLHDL, VLDL, LDLCALC ?No results found for: TSH ? ?Therapeutic Level Labs: ?No results found for: LITHIUM ?No results found for: VALPROATE ?No components found for:  CBMZ ? ?Current Medications: ?Current Outpatient Medications  ?Medication Sig Dispense Refill  ? busPIRone (BUSPAR) 10 MG tablet Take 1 tablet (10 mg total) by mouth 2 (two) times daily. 60 tablet 2  ? Prenatal Vit-Fe Fumarate-FA (PRENATAL MULTIVITAMIN) TABS tablet Take 1 tablet by mouth daily at 12 noon.    ? mirtazapine (REMERON) 15 MG tablet Take 1 tablet (15 mg total) by mouth at bedtime. 30 tablet 0  ? ?No current facility-administered medications for this visit.  ? ? ? ?Musculoskeletal: ?Strength & Muscle Tone: within normal limits ?Gait & Station: normal ?Patient leans: N/A ? ?Psychiatric Specialty Exam: ?Review of Systems  ?Blood pressure 121/79, pulse 79, height 5\' 5"  (1.651 m), weight 232 lb (105.2 kg), unknown if currently breastfeeding.Body mass index is 38.61 kg/m?.  ?General Appearance: Well Groomed  ?Eye Contact:  Good  ?Speech:  Normal Rate  ?Volume:  Normal  ?Mood:  Anxious and Dysphoric  ?Affect:  Constricted  ?Thought Process:  Coherent  ?Orientation:  Full (Time, Place, and Person)  ?Thought Content: Logical   ?Suicidal Thoughts:  No  ?Homicidal Thoughts:  No  ?Memory:  Immediate;   Good ?Recent;   Good  ?Judgement:  Good  ?Insight:  Good  ?Psychomotor Activity:  Normal  ?Concentration:  Concentration: Good and Attention Span: Good  ?Recall:  Good  ?Fund of Knowledge: Good  ?Language: Good  ?Akathisia:  No  ?Handed:  Right  ?AIMS (if indicated): not done Pt not on antipsychotics  ?Assets:  Communication Skills ?Desire for Improvement ?Housing ?Resilience ?Social Support ?Talents/Skills ?Transportation ?Vocational/Educational  ?ADL's:  Intact  ?Cognition: WNL  ?Sleep:  Good   ? ?Screenings: ? ? ?Assessment and Plan: Patient is 31 year old female with history of MDD, anxiety, postpartum depression and PTSD presented to [facility] behavioral health outpatient clinic for medication management. ?Patient carries a diagnosis of MDD.  Patient reports improvement in sleep, mood, appetite and anhedonia but reports vivid nightmares.  Discussed that she can hold Remeron for 1-2 nights to see if nightmares get better. If she still gets nightmares even after stopping Remeron then she start taking Remeron again with increased dose of 15 mg nightly. ? ?MDD, moderate, without psychotic features ?Anxiety ?-Lexapro stopped due to sexual side effects ?-Hold Remeron for 1-2 nights to see if nightmares get better. ?-Increase Remeron to 15 mg nightly to help with depression. (R/B/ A/ SE discussed and patient agrees with the medication trial). 30-day prescription with 2 refills sent to her for pharmacy. ?-Continue BuSpar 10 mg twice daily to help with anxiety.  Take this  medication regularly as prescribed and not as needed.  ?  ?Menorrhagia ?-Patient saw gynecologist and prescribed high-dose oral contraception pills to control bleeding. ? ?Follow-up: 1 weeks, ok for phone follow-up. ?  ? ?Collaboration of Care: Collaboration of Care: Psychiatrist AEB   ? ?Patient/Guardian was advised Release of Information must be obtained prior to any record release in order to collaborate their care with an outside provider. Patient/Guardian was advised if they have not already done so to contact the registration department to sign all necessary forms in order for us to release information regarding their care.  ? ?Consent: Patient/Guardian gives verbal consent for treatment and assignment of benefits for services provided during this visit. Patient/Guardian expressed understanding and agreed to proceed.  ? ? ?Karsten RoVandana  Cleven Jansma, MD ?02/04/2022, 2:25 PM ? ?

## 2022-02-11 ENCOUNTER — Ambulatory Visit (INDEPENDENT_AMBULATORY_CARE_PROVIDER_SITE_OTHER): Payer: Medicaid Other | Admitting: Psychiatry

## 2022-02-11 DIAGNOSIS — F331 Major depressive disorder, recurrent, moderate: Secondary | ICD-10-CM | POA: Diagnosis not present

## 2022-02-11 MED ORDER — MIRTAZAPINE 7.5 MG PO TABS: 7.5000 mg | ORAL_TABLET | Freq: Every day | ORAL | 2 refills | Status: DC

## 2022-02-11 NOTE — Progress Notes (Addendum)
Virtual Visit via Telephone Note ? ?I connected with Cristobal Goldmann on 02/11/22 at  1:30 PM EDT by telephone and verified that I am speaking with the correct person using two identifiers. ? ?Location: ?Patient: Pt's home ?Provider: [facility] ?  ?I discussed the limitations, risks, security and privacy concerns of performing an evaluation and management service by telephone and the availability of in person appointments. I also discussed with the patient that there may be a patient responsible charge related to this service. The patient expressed understanding and agreed to proceed. ? ?History of Present Illness: Patient states she is doing very well.  She reports that she was little apprehensive about stopping Remeron for 1 or 2 days so she stopped her high dose of hormone birth control which was recently started by her gynecologist to control uterine bleeding.  She reports that nightmares completely stopped after stopping hormone pills. ?She reports that she did not stop Remeron even for 1 day.  She reports improvement in her mood, anxiety, sleep, and appetite.  She shares that she got nominated for an award at [employer].  She reports that she is experiencing little bit more crying episodes.  She reports she is eating 4-5 meals a day.  Discussed side effects of Remeron including weight gain.  Encouraged her to eat healthy with more fruits and vegetables and less carbs to avoid weight gain.  She verbalizes understanding.  She reports that she does not feel drowsy at all during the daytime as she was feeling in first 2 weeks.  She reports that her irritability has also improved.  She does not feel fatigue.  She has been taking BuSpar regularly as prescribed.  She reports she experience brain fog up to max 20 minutes after taking BuSpar so she has to be careful when she takes it.  She is still reporting some social anxiety especially when she has to face a lot of people.  She denies any active or passive SI, HI,  AVH.  Patient wants to continue Remeron at 7.5 mg for next few weeks to see if its full effect. Patient has been tolerating her medications well without any side effects. ? ?Assessment and Plan:  ?Patient is 31 year old female with history of MDD, anxiety, postpartum depression and PTSD followed up via telephone consultation for medication management. ?Patient reports improvement in her mood, anxiety, sleep and appetite.  Nightmares completely gone after stopping hormone pills.  Patient has been tolerating her medications well without any side effects.  We will not make any changes in medication this time.  Patient wants to continue Remeron 7.5 for next few weeks to see its full effect before making a decision to increase dose. ? ?MDD, moderate, without psychotic features ?Anxiety ?-Lexapro stopped due to sexual side effects. ?-Continue Remeron to 7.5 mg nightly to help with depression. (R/B/ A/ SE discussed and patient agrees with the medication trial). 30-day prescription with 2 refills sent to her for pharmacy. ?-Continue BuSpar 10 mg twice daily to help with anxiety. Take this medication regularly as prescribed and not as needed.  ?  ?Menorrhagia ?-Patient has been implant.  She has stopped high dose hormone pill due to nightmares. ?-She is not reporting any more bleeding. ?  ? ?Follow Up Instructions: ? ?Follow-up: 4 weeks in person. ? ?I discussed the assessment and treatment plan with the patient. The patient was provided an opportunity to ask questions and all were answered. The patient agreed with the plan and demonstrated an  understanding of the instructions. ?  ?The patient was advised to call back or seek an in-person evaluation if the symptoms worsen or if the condition fails to improve as anticipated. ? ?I provided 20 minutes of non-face-to-face time during this encounter. ?Extra 10 minutes was spent in chart review, documentation and sending prescriptions. ? ?Armando Reichert, MD  ?

## 2022-03-04 ENCOUNTER — Encounter (HOSPITAL_COMMUNITY): Payer: Medicaid Other | Admitting: Psychiatry

## 2022-03-11 ENCOUNTER — Encounter (HOSPITAL_COMMUNITY): Payer: Medicaid Other | Admitting: Psychiatry

## 2022-03-11 ENCOUNTER — Telehealth (HOSPITAL_COMMUNITY): Payer: Self-pay | Admitting: Psychiatry

## 2022-03-11 NOTE — Telephone Encounter (Signed)
Pt came for appt with [facility], which was canceled due to provider sick (her voicemail box was full so we could not leave a message regarding the canceled appt). Pt says she has been 10 days without antidepressant medication.  Says she had side effects from Mirtazapine. Pt says medication was to help with sleep and eating. Pt says she was constantly eating and falling asleep in the middle of conversations and felt very edgy in a constat state of anger. [facility] was not able to [facility] days, so pt realized the medication was causing the problems. Please advise. [facility] on [address]

## 2022-03-12 NOTE — Telephone Encounter (Signed)
Patient expressed concern of "walking around" without any antidepressant in her system. Pt reports being unable to come during walk-in hours and there is no sooner appt than May 11 with provider.

## 2022-04-08 ENCOUNTER — Ambulatory Visit (INDEPENDENT_AMBULATORY_CARE_PROVIDER_SITE_OTHER): Payer: Medicaid Other | Admitting: Psychiatry

## 2022-04-08 ENCOUNTER — Encounter (HOSPITAL_COMMUNITY): Payer: Self-pay | Admitting: Psychiatry

## 2022-04-08 VITALS — BP 129/75 | HR 68 | Ht 65.0 in | Wt 236.0 lb

## 2022-04-08 DIAGNOSIS — F331 Major depressive disorder, recurrent, moderate: Secondary | ICD-10-CM

## 2022-04-08 DIAGNOSIS — F4310 Post-traumatic stress disorder, unspecified: Secondary | ICD-10-CM | POA: Diagnosis not present

## 2022-04-08 MED ORDER — QUETIAPINE FUMARATE 50 MG PO TABS: 50.0000 mg | ORAL_TABLET | Freq: Every day | ORAL | 2 refills | Status: DC

## 2022-04-08 MED ORDER — OXCARBAZEPINE 150 MG PO TABS: 150.0000 mg | ORAL_TABLET | Freq: Two times a day (BID) | ORAL | 2 refills | Status: DC

## 2022-04-08 NOTE — Patient Instructions (Signed)
Follow up in Approx 4 weeks.  ?

## 2022-04-08 NOTE — Progress Notes (Signed)
[facility] MD/PA/NP OP Progress Note ? ?04/08/2022 3:10 PM ?Becky Gomez  ?MRN:  [id number] ? ?Chief Complaint:  ?No chief complaint on file. ? ?HPI: Patient is a 31 year old female with psychiatric history of MDD, anxiety, postpartum depression and PTSD presented to [facility] behavioral health outpatient clinic for medication management. ? ?Patient seen for follow-up at [facility] outpatient clinic.  Patient states she stopped Remeron due to excessive sedation, irritability, and increased anxiety.  She has not been on any antidepressant for last 1 month and does not feel too depressed as she expected.  Currently, she reports poor sleep, fatigue, low energy, anhedonia, irritability, impulsivity, mood lability and increased anxiety.  She reports that she feels on edge and others think that she is unpredictable and can lash out any moment. She reports that she is not able to feel any joy as she won a Well[employer] recently at her college but still was not happy and felt numb.  She reports stable appetite.  Currently, patient denies active or passive suicidal ideation, homicidal ideation, auditory and visual hallucinations.  She contracts for safety at this time.  She reports that she had moments when she felt passively suicidal.  Discussed starting mood stabilizer including Seroquel to help with mood lability, depression, irritability and insomnia and Trileptal for mood lability and irritability.  Patient agrees with the plan.  ? ?Visit Diagnosis:  ?  ICD-10-CM   ?1. MDD (major depressive disorder), recurrent episode, moderate (HCC)  F33.1 QUEtiapine (SEROQUEL) 50 MG tablet  ?  OXcarbazepine (TRILEPTAL) 150 MG tablet  ?  ?2. PTSD (post-traumatic stress disorder)  F43.10   ?  ? ? ? ?Past Psychiatric History: MDD, Anxiety and PTSD ?1 previous suicidal attempt by overdosing in 2009. ? ?Past Medical History:  ?Past Medical History:  ?Diagnosis Date  ? Asthma    ? Bronchitis   ? Depression   ? Headache   ? Hypoglycemia   ?  ?Past Surgical History:  ?Procedure Laterality Date  ? CHOLECYSTECTOMY    ? ? ?Family Psychiatric History: Dad - history of bipolar 1, drug and alcohol addiction ?No suicidal attempts in family. ? ?Family History: No family history on file. ? ?Social History:  ?Social History  ? ?Socioeconomic History  ? Marital status: Married  ?  Spouse name: Not on file  ? Number of children: Not on file  ? Years of education: Not on file  ? Highest education level: Not on file  ?Occupational History  ? Not on file  ?Tobacco Use  ? Smoking status: Never  ? Smokeless tobacco: Never  ?Substance and Sexual Activity  ? Alcohol use: No  ? Drug use: No  ? Sexual activity: Yes  ?Other Topics Concern  ? Not on file  ?Social History Narrative  ? Not on file  ? ?Social Determinants of Health  ? ?Financial Resource Strain: Not on file  ?Food Insecurity: Not on file  ?Transportation Needs: Not on file  ?Physical Activity: Not on file  ?Stress: Not on file  ?Social Connections: Not on file  ? ? ?Allergies:  ?Allergies  ?Allergen Reactions  ? Penicillins Nausea And Vomiting  ?  Has patient had a PCN reaction causing immediate rash, facial/tongue/throat swelling, SOB or lightheadedness with hypotension: no ?Has patient had a PCN reaction causing severe rash involving mucus membranes or skin necrosis: no ?Has patient had a PCN reaction that required hospitalization: no ?Has patient had a PCN reaction occurring within the last 10 years: no ?If all  of the above answers are "NO", then may proceed with Cephalosporin use. ?  ? ? ?Metabolic Disorder Labs: ?No results found for: HGBA1C, MPG ?No results found for: PROLACTIN ?No results found for: CHOL, TRIG, HDL, CHOLHDL, VLDL, LDLCALC ?No results found for: TSH ? ?Therapeutic Level Labs: ?No results found for: LITHIUM ?No results found for: VALPROATE ?No components found for:  CBMZ ? ?Current Medications: ?Current Outpatient Medications   ?Medication Sig Dispense Refill  ? OXcarbazepine (TRILEPTAL) 150 MG tablet Take 1 tablet (150 mg total) by mouth 2 (two) times daily. 60 tablet 2  ? QUEtiapine (SEROQUEL) 50 MG tablet Take 1 tablet (50 mg total) by mouth at bedtime. 30 tablet 2  ? Prenatal Vit-Fe Fumarate-FA (PRENATAL MULTIVITAMIN) TABS tablet Take 1 tablet by mouth daily at 12 noon.    ? ?No current facility-administered medications for this visit.  ? ? ? ?Musculoskeletal: ?Strength & Muscle Tone: within normal limits ?Gait & Station: normal ?Patient leans: N/A ? ?Psychiatric Specialty Exam: ?Review of Systems  ?Blood pressure 129/75, pulse 68, height 5\' 5"  (1.651 m), weight 236 lb (107 kg), SpO2 100 %, unknown if currently breastfeeding.Body mass index is 39.27 kg/m?.  ?General Appearance: Well Groomed  ?Eye Contact:  Good  ?Speech:  Normal Rate  ?Volume:  Normal  ?Mood:  Anxious and Dysphoric  ?Affect:  Constricted  ?Thought Process:  Coherent  ?Orientation:  Full (Time, Place, and Person)  ?Thought Content: Logical   ?Suicidal Thoughts:  No  ?Homicidal Thoughts:  No  ?Memory:  Immediate;   Good ?Recent;   Good  ?Judgement:  Good  ?Insight:  Good  ?Psychomotor Activity:  Normal  ?Concentration:  Concentration: Good and Attention Span: Good  ?Recall:  Good  ?Fund of Knowledge: Good  ?Language: Good  ?Akathisia:  No  ?Handed:  Right  ?AIMS (if indicated): not done Pt not on antipsychotics  ?Assets:  Communication Skills ?Desire for Improvement ?Housing ?Resilience ?Social Support ?Talents/Skills ?Transportation ?Vocational/Educational  ?ADL's:  Intact  ?Cognition: WNL  ?Sleep:  Good  ? ?Screenings: ? ? ?Assessment and Plan: Patient is 31 year old female with history of MDD, anxiety, postpartum depression and PTSD came for follow-up for medication management. ?Medication trial failure: Zoloft, Lexapro, Remeron ? ?MDD, moderate, without psychotic features ?Anxiety ?-Lexapro stopped due to sexual side effects. ?-Remeron stopped due to sedation,  increased anxiety and irritability. ?-Start Seroquel 50 mg for mood stability, depression, irritability and insomnia. (R/B/ A/ SE discussed and patient agrees with the medication trial). 30-day prescription with 2 refills sent to her for pharmacy. ?-Start Trileptal 150 mg twice daily for mood stability and irritability (R/B/ A/ SE discussed and patient agrees with the medication trial).  ?-Continue BuSpar 10 mg twice daily to help with anxiety. ?  ?Menorrhagia ?-Patient has been implant.  ?-F/u with Gynecology. ? ?Follow Up Instructions: ? ?Follow-up: Approx 4 weeks. ? ?Patient is instructed to take all medications as prescribed by her mental healthcare provider. Report any adverse effects and or reactions from the medicines to her outpatient provider promptly. Patient has been instructed & cautioned: To not engage in alcohol and or illegal drug use while on prescription medicines. In the event of worsening symptoms, patient is instructed to call the crisis hotline, 911 and or [facility] the [facility] for appropriate evaluation and treatment of symptoms. ?Discussed that patient can always [facility] if she needs to see a provider urgently. ? ?Consent: Patient/Guardian gives verbal consent for treatment and assignment  of benefits for services provided during this visit. Patient/Guardian expressed understanding and agreed to proceed.  ? ? ?Karsten Ro, MD PGy2 ?04/08/2022, 3:10 PM ? ?

## 2022-05-13 ENCOUNTER — Encounter (HOSPITAL_COMMUNITY): Payer: Medicaid Other | Admitting: Psychiatry

## 2022-07-08 ENCOUNTER — Encounter (HOSPITAL_COMMUNITY): Payer: Medicaid Other | Admitting: Physician Assistant

## 2022-07-22 ENCOUNTER — Telehealth (INDEPENDENT_AMBULATORY_CARE_PROVIDER_SITE_OTHER): Payer: Medicaid Other | Admitting: Physician Assistant

## 2022-07-22 ENCOUNTER — Encounter (HOSPITAL_COMMUNITY): Payer: Self-pay | Admitting: Physician Assistant

## 2022-07-22 DIAGNOSIS — F331 Major depressive disorder, recurrent, moderate: Secondary | ICD-10-CM

## 2022-07-22 MED ORDER — QUETIAPINE FUMARATE 100 MG PO TABS: ORAL_TABLET | ORAL | 2 refills | Status: DC

## 2022-07-22 MED ORDER — OXCARBAZEPINE 150 MG PO TABS: 150.0000 mg | ORAL_TABLET | Freq: Two times a day (BID) | ORAL | 2 refills | Status: DC

## 2022-07-22 NOTE — Progress Notes (Signed)
[facility] MD/PA/NP OP Progress Note  Virtual Visit via Telephone Note  I connected with Becky Gomez on 07/22/22 at 10:30 AM EDT by telephone and verified that I am speaking with the correct person using two identifiers.  Location: Patient: Home Provider: Clinic   I discussed the limitations, risks, security and privacy concerns of performing an evaluation and management service by telephone and the availability of in person appointments. I also discussed with the patient that there may be a patient responsible charge related to this service. The patient expressed understanding and agreed to proceed.  Follow Up Instructions:   I discussed the assessment and treatment plan with the patient. The patient was provided an opportunity to ask questions and all were answered. The patient agreed with the plan and demonstrated an understanding of the instructions.   The patient was advised to call back or seek an in-person evaluation if the symptoms worsen or if the condition fails to improve as anticipated.  I provided 32 minutes of non-face-to-face time during this encounter.  Malachy Mood, PA    07/22/2022 12:43 PM Becky Gomez  MRN:  [id number]  Chief Complaint:  Chief Complaint  Patient presents with   Follow-up   Medication Management   HPI:   Becky Gomez is a 31 year old female with a past psychiatric history significant for major depressive disorder anxiety who presents to [facility] via virtual telephone visit for follow-up and medication management.  Patient was previously seen by Dr. Armando Reichert on 5 09/2022.  During her last encounter with Dr. Rosita Kea, patient was taking the following medications:  Oxcarbazepine 150 mg 2 times daily Seroquel 50 mg at bedtime BuSpar 10 mg 2 times daily  Patient presented to the encounter stating that she is currently not taking any medications and has been off her medications since the week  after 4th of July.  Patient states that she stopped taking her medications due to experiencing side effects.  Prior to experiencing side effects, patient states that her medications were working.  During that time frame in which her medications were helpful, patient endorsed being happy, joyful, and great for roughly 3 weeks.  She states that her mood then proceeded to "crash" and she started feeling general sadness, and a lack of desire to interact with family or work.   Patient describes herself as being terribly depressed.  She states that she has not gone out unless it was for work.  Patient is a full-time Electronics engineer and states that she missed 2 days of classes due to her depression.  Notes difficulty getting out of bed and states that whenever she does get out of bed, it is at the last minute and she will often go on the very first thing she finds.  Patient reports that she recently slept for 5 days straight.  She reports that her depressive episodes have been accompanied by suicidal thoughts.  Patient has been dealing with depression for 4 years in conjunction with PTSD and anxiety.  She has a past history of being on Zoloft and Lexapro from other facilities she has attended in the past.  Patient reports that she is exhausted over the ordeal of being placed numerous different medications and is desperate for relief.  PHQ-9 screen was performed with the patient scoring a 19.  A GAD-7 screen was also performed with the patient scoring a 15.  Patient is alert and oriented x 4, calm, cooperative, and fully engaged  in conversation during the encounter.  Patient describes her current mood as sad.  Patient denies suicidal or homicidal ideations.  She further denies auditory or visual hallucinations and does not appear to be responding to internal/external stimuli.  Patient endorses fair sleep and receives on average 5 hours of sleep at a time.  Patient states that she does not have a desire to eat and  whenever she does eat, she only takes a few bites of what of her meal she has, which is not normal for her.  Patient endorses alcohol consumption sparingly and states that never she does drink, she drinks socially.  Patient denies tobacco use.  Patient denies illicit drug use but does state that she uses CBD Gummies to help manage her anxiety and sleep.  Visit Diagnosis:    ICD-10-CM   1. MDD (major depressive disorder), recurrent episode, moderate (HCC)  F33.1 QUEtiapine (SEROQUEL) 100 MG tablet    OXcarbazepine (TRILEPTAL) 150 MG tablet      Past Psychiatric History:  Major depressive disorder Anxiety PTSD  1 previous suicide attempt via overdose in 2009  Past Medical History:  Past Medical History:  Diagnosis Date   Asthma    Bronchitis    Depression    Headache    Hypoglycemia     Past Surgical History:  Procedure Laterality Date   CHOLECYSTECTOMY      Family Psychiatric History:  Father - History of bipolar 1 disorder, drug, and alcohol addiction No suicidal attempts in family  Family History: History reviewed. No pertinent family history.  Social History:  Social History   Socioeconomic History   Marital status: Married    Spouse name: Not on file   Number of children: Not on file   Years of education: Not on file   Highest education level: Not on file  Occupational History   Not on file  Tobacco Use   Smoking status: Never   Smokeless tobacco: Never  Substance and Sexual Activity   Alcohol use: No   Drug use: No   Sexual activity: Yes  Other Topics Concern   Not on file  Social History Narrative   Not on file   Social Determinants of Health   Financial Resource Strain: Not on file  Food Insecurity: Not on file  Transportation Needs: Not on file  Physical Activity: Not on file  Stress: Not on file  Social Connections: Not on file    Allergies:  Allergies  Allergen Reactions   Penicillins Nausea And Vomiting    Has patient had a PCN  reaction causing immediate rash, facial/tongue/throat swelling, SOB or lightheadedness with hypotension: no Has patient had a PCN reaction causing severe rash involving mucus membranes or skin necrosis: no Has patient had a PCN reaction that required hospitalization: no Has patient had a PCN reaction occurring within the last 10 years: no If all of the above answers are "NO", then may proceed with Cephalosporin use.     Metabolic Disorder Labs: No results found for: "HGBA1C", "MPG" No results found for: "PROLACTIN" No results found for: "CHOL", "TRIG", "HDL", "CHOLHDL", "VLDL", "LDLCALC" No results found for: "TSH"  Therapeutic Level Labs: No results found for: "LITHIUM" No results found for: "VALPROATE" No results found for: "CBMZ"  Current Medications: Current Outpatient Medications  Medication Sig Dispense Refill   OXcarbazepine (TRILEPTAL) 150 MG tablet Take 1 tablet (150 mg total) by mouth 2 (two) times daily. 60 tablet 2   Prenatal Vit-Fe Fumarate-FA (PRENATAL MULTIVITAMIN) TABS tablet Take  1 tablet by mouth daily at 12 noon.     QUEtiapine (SEROQUEL) 100 MG tablet Take 0.5 tablets (50 mg total) by mouth at bedtime for 4 days, THEN 1 tablet (100 mg total) at bedtime. 30 tablet 2   No current facility-administered medications for this visit.     Musculoskeletal: Strength & Muscle Tone: Unable to assess due to telemedicine visit Gait & Station: Unable to assess due to telemedicine visit Patient leans: Unable to assess due to telemedicine visit  Psychiatric Specialty Exam: Review of Systems  Psychiatric/Behavioral:  Positive for dysphoric mood and sleep disturbance. Negative for decreased concentration, hallucinations, self-injury and suicidal ideas. The patient is nervous/anxious. The patient is not hyperactive.     unknown if currently breastfeeding.There is no height or weight on file to calculate BMI.  General Appearance: Unable to assess due to telemedicine visit  Eye  Contact:  Unable to assess due to telemedicine visit  Speech:  Clear and Coherent and Normal Rate  Volume:  Normal  Mood:  Anxious, Depressed, and Dysphoric  Affect:  Congruent and Depressed  Thought Process:  Coherent, Goal Directed, and Descriptions of Associations: Intact  Orientation:  Full (Time, Place, and Person)  Thought Content: WDL   Suicidal Thoughts:  No  Homicidal Thoughts:  No  Memory:  Immediate;   Good Recent;   Good Remote;   Good  Judgement:  Good  Insight:  Good  Psychomotor Activity:  Normal  Concentration:  Concentration: Good and Attention Span: Good  Recall:  Good  Fund of Knowledge: Good  Language: Good  Akathisia:  No  Handed:  Right  AIMS (if indicated): not done  Assets:  Communication Skills Desire for Improvement Housing Resilience Social Support Talents/Skills Transportation Vocational/Educational  ADL's:  Intact  Cognition: WNL  Sleep:  Fair   Screenings: GAD-7    Flowsheet Row Video Visit from 07/22/2022 in [facility]  Total GAD-7 Score 15      PHQ2-9    Flowsheet Row Video Visit from 07/22/2022 in [facility]  PHQ-2 Total Score 5  PHQ-9 Total Score 19      Flowsheet Row Video Visit from 07/22/2022 in [facility]  C-SSRS RISK CATEGORY Moderate Risk        Assessment and Plan:   Becky Gomez is a 31 year old female with a past psychiatric history significant for major depressive disorder anxiety who presents to [facility] via virtual telephone visit for follow-up and medication management.  Patient was previously seen by Dr. Karsten Ro on 5 09/2022.  Patient presents today stating that she has currently discontinued taking all of her medications.  She reports that she discontinued taking her medications after experiencing side effects from her regimen.  Patient reports that she felt generally  well for 3 weeks when on the medications but then started to experience depressive symptoms once again.  Patient was not able to name specific side effects that she experienced and only stated that she started experiencing what appears to be breakthrough symptoms from her use of her medications.  Patient was recommended being placed back on her Seroquel 50 mg at bedtime for 4 days followed by 100 mg at bedtime for the management of her depressive symptoms.  Patient was also recommended being placed back on her oxcarbazepine 150 mg 2 times daily for the management of mood stability.  Patient is agreeable to recommendations.  Patient was not aware that  she was supposed to be taking buspirone.  Patient would like to hold off on taking buspirone at this time.  Patient's medications to be prescribed to pharmacy of choice.  Collaboration of Care: Collaboration of Care: Medication Management AEB provider managing patient's psychiatric medications, Psychiatrist AEB patient being seen by a mental health provider, and Referral or follow-up with counselor/therapist AEB patient reporting that she sees a counselor that she has been seeing for 5 years.  Patient/Guardian was advised Release of Information must be obtained prior to any record release in order to collaborate their care with an outside provider. Patient/Guardian was advised if they have not already done so to contact the registration department to sign all necessary forms in order for [place] to release information regarding their care.   Consent: Patient/Guardian gives verbal consent for treatment and assignment of benefits for services provided during this visit. Patient/Guardian expressed understanding and agreed to proceed.   1. MDD (major depressive disorder), recurrent episode, moderate (HCC)  - QUEtiapine (SEROQUEL) 100 MG tablet; Take 0.5 tablets (50 mg total) by mouth at bedtime for 4 days, THEN 1 tablet (100 mg total) at bedtime.  Dispense: 30  tablet; Refill: 2 - OXcarbazepine (TRILEPTAL) 150 MG tablet; Take 1 tablet (150 mg total) by mouth 2 (two) times daily.  Dispense: 60 tablet; Refill: 2  Patient to follow up in 3 weeks Provider spent a total of 32 minutes with the patient/reviewing patient's chart  Malachy Mood, PA 07/22/2022, 12:43 PM

## 2022-08-10 ENCOUNTER — Telehealth (HOSPITAL_COMMUNITY): Payer: Medicaid Other | Admitting: Physician Assistant

## 2024-07-01 ENCOUNTER — Emergency Department (HOSPITAL_COMMUNITY): Payer: Self-pay

## 2024-07-01 ENCOUNTER — Other Ambulatory Visit: Payer: Self-pay

## 2024-07-01 ENCOUNTER — Observation Stay (HOSPITAL_COMMUNITY)
Admission: EM | Admit: 2024-07-01 | Discharge: 2024-07-04 | Disposition: A | Discharge status: Home / Self Care | Payer: Self-pay | Attending: Internal Medicine | Admitting: Internal Medicine

## 2024-07-01 ENCOUNTER — Encounter (HOSPITAL_COMMUNITY): Payer: Self-pay | Admitting: *Deleted

## 2024-07-01 DIAGNOSIS — D72829 Elevated white blood cell count, unspecified: Secondary | ICD-10-CM

## 2024-07-01 DIAGNOSIS — R0683 Snoring: Secondary | ICD-10-CM

## 2024-07-01 DIAGNOSIS — G8321 Monoplegia of upper limb affecting right dominant side: Secondary | ICD-10-CM | POA: Insufficient documentation

## 2024-07-01 DIAGNOSIS — I2694 Multiple subsegmental pulmonary emboli without acute cor pulmonale: Principal | ICD-10-CM

## 2024-07-01 DIAGNOSIS — F419 Anxiety disorder, unspecified: Secondary | ICD-10-CM | POA: Insufficient documentation

## 2024-07-01 DIAGNOSIS — R438 Other disturbances of smell and taste: Secondary | ICD-10-CM

## 2024-07-01 DIAGNOSIS — R29898 Other symptoms and signs involving the musculoskeletal system: Secondary | ICD-10-CM

## 2024-07-01 DIAGNOSIS — R0602 Shortness of breath: Secondary | ICD-10-CM

## 2024-07-01 DIAGNOSIS — I2699 Other pulmonary embolism without acute cor pulmonale: Principal | ICD-10-CM | POA: Diagnosis present

## 2024-07-01 DIAGNOSIS — R7989 Other specified abnormal findings of blood chemistry: Secondary | ICD-10-CM

## 2024-07-01 DIAGNOSIS — R432 Parageusia: Secondary | ICD-10-CM | POA: Insufficient documentation

## 2024-07-01 LAB — COMPREHENSIVE METABOLIC PANEL WITH GFR
ALT: 33 U/L (ref 0–44)
AST: 25 U/L (ref 15–41)
Albumin: 3.8 g/dL (ref 3.5–5.0)
Alkaline Phosphatase: 90 U/L (ref 38–126)
Anion gap: 9 (ref 5–15)
BUN: 7 mg/dL (ref 6–20)
CO2: 23 mmol/L (ref 22–32)
Calcium: 10.5 mg/dL — ABNORMAL HIGH (ref 8.9–10.3)
Chloride: 110 mmol/L (ref 98–111)
Creatinine, Ser: 0.77 mg/dL (ref 0.44–1.00)
GFR, Estimated: >60 mL/min (ref >60)
Glucose, Bld: 95 mg/dL (ref 70–99)
Potassium: 4.1 mmol/L (ref 3.5–5.1)
Sodium: 142 mmol/L (ref 135–145)
Total Bilirubin: 0.6 mg/dL (ref 0.0–1.2)
Total Protein: 7.1 g/dL (ref 6.5–8.1)

## 2024-07-01 LAB — CBC WITH DIFFERENTIAL/PLATELET
Abs Immature Granulocytes: 0.09 K/uL — ABNORMAL HIGH (ref 0.00–0.07)
Basophils Absolute: 0.1 K/uL (ref 0.0–0.1)
Basophils Relative: 1 %
Eosinophils Absolute: 3.9 K/uL — ABNORMAL HIGH (ref 0.0–0.5)
Eosinophils Relative: 30 %
HCT: 42.9 % (ref 36.0–46.0)
Hemoglobin: 13.6 g/dL (ref 12.0–15.0)
Immature Granulocytes: 1 %
Lymphocytes Relative: 25 %
Lymphs Abs: 3.4 K/uL (ref 0.7–4.0)
MCH: 27.5 pg (ref 26.0–34.0)
MCHC: 31.7 g/dL (ref 30.0–36.0)
MCV: 86.8 fL (ref 80.0–100.0)
Monocytes Absolute: 0.6 K/uL (ref 0.1–1.0)
Monocytes Relative: 4 %
Neutro Abs: 5.2 K/uL (ref 1.7–7.7)
Neutrophils Relative %: 39 %
Platelets: 331 K/uL (ref 150–400)
RBC: 4.94 MIL/uL (ref 3.87–5.11)
RDW: 14 % (ref 11.5–15.5)
WBC: 13.2 K/uL — ABNORMAL HIGH (ref 4.0–10.5)
nRBC: 0 % (ref 0.0–0.2)

## 2024-07-01 LAB — URINALYSIS, ROUTINE W REFLEX MICROSCOPIC
Bilirubin Urine: NEGATIVE
Glucose, UA: NEGATIVE mg/dL
Ketones, ur: NEGATIVE mg/dL
Leukocytes,Ua: NEGATIVE
Nitrite: NEGATIVE
Protein, ur: NEGATIVE mg/dL
Specific Gravity, Urine: 1.009 (ref 1.005–1.030)
pH: 5 (ref 5.0–8.0)

## 2024-07-01 LAB — TROPONIN I (HIGH SENSITIVITY)
Troponin I (High Sensitivity): 58 ng/L — ABNORMAL HIGH (ref <18)
Troponin I (High Sensitivity): 52 ng/L — ABNORMAL HIGH (ref <18)

## 2024-07-01 LAB — RAPID URINE DRUG SCREEN, HOSP PERFORMED
Amphetamines: NOT DETECTED
Barbiturates: NOT DETECTED
Benzodiazepines: NOT DETECTED
Cocaine: NOT DETECTED
Opiates: NOT DETECTED
Tetrahydrocannabinol: NOT DETECTED

## 2024-07-01 LAB — D-DIMER, QUANTITATIVE: D-Dimer, Quant: 1.05 ug{FEU}/mL — ABNORMAL HIGH (ref 0.00–0.50)

## 2024-07-01 LAB — PROCALCITONIN: Procalcitonin: <0.1 ng/mL

## 2024-07-01 LAB — TSH: TSH: 3.944 u[IU]/mL (ref 0.350–4.500)

## 2024-07-01 LAB — SEDIMENTATION RATE: Sed Rate: 9 mm/h (ref 0–22)

## 2024-07-01 LAB — HCG, SERUM, QUALITATIVE: Preg, Serum: NEGATIVE

## 2024-07-01 LAB — HIV ANTIBODY (ROUTINE TESTING W REFLEX): HIV Screen 4th Generation wRfx: NONREACTIVE

## 2024-07-01 LAB — MAGNESIUM: Magnesium: 2.1 mg/dL (ref 1.7–2.4)

## 2024-07-01 LAB — BRAIN NATRIURETIC PEPTIDE: B Natriuretic Peptide: 21.5 pg/mL (ref 0.0–100.0)

## 2024-07-01 LAB — C-REACTIVE PROTEIN: CRP: 0.6 mg/dL (ref <1.0)

## 2024-07-01 MED ORDER — ACETAMINOPHEN 325 MG PO TABS
650.0000 mg | ORAL_TABLET | Freq: Four times a day (QID) | ORAL | Status: DC | PRN
  Administered 2024-07-03: 650 mg via ORAL
  Filled 2024-07-01 (×2): qty 2

## 2024-07-01 MED ORDER — FAMOTIDINE 20 MG PO TABS
20.0000 mg | ORAL_TABLET | Freq: Every day | ORAL | Status: DC
  Administered 2024-07-01 – 2024-07-04 (×4): 20 mg via ORAL
  Filled 2024-07-01 (×4): qty 1

## 2024-07-01 MED ORDER — ONDANSETRON HCL 4 MG/2ML IJ SOLN: 4.0000 mg | Freq: Four times a day (QID) | INTRAMUSCULAR | Status: DC | PRN

## 2024-07-01 MED ORDER — ORAL CARE MOUTH RINSE: 15.0000 mL | OROMUCOSAL | Status: DC | PRN

## 2024-07-01 MED ORDER — ENOXAPARIN SODIUM 120 MG/0.8ML IJ SOSY
110.0000 mg | PREFILLED_SYRINGE | Freq: Two times a day (BID) | INTRAMUSCULAR | Status: DC
  Administered 2024-07-02 – 2024-07-03 (×3): 110 mg via SUBCUTANEOUS
  Filled 2024-07-01 (×4): qty 0.74

## 2024-07-01 MED ORDER — IOHEXOL 350 MG/ML SOLN
75.0000 mL | Freq: Once | INTRAVENOUS | Status: AC | PRN
  Administered 2024-07-01: 75 mL via INTRAVENOUS

## 2024-07-01 MED ORDER — ENOXAPARIN SODIUM 120 MG/0.8ML IJ SOSY: 110.0000 mg | PREFILLED_SYRINGE | Freq: Two times a day (BID) | INTRAMUSCULAR | Status: DC

## 2024-07-01 MED ORDER — ONDANSETRON HCL 4 MG PO TABS: 4.0000 mg | ORAL_TABLET | Freq: Four times a day (QID) | ORAL | Status: DC | PRN

## 2024-07-01 MED ORDER — ACETAMINOPHEN 650 MG RE SUPP: 650.0000 mg | Freq: Four times a day (QID) | RECTAL | Status: DC | PRN

## 2024-07-01 MED ORDER — ACETAMINOPHEN 500 MG PO TABS
1000.0000 mg | ORAL_TABLET | Freq: Once | ORAL | Status: AC
  Administered 2024-07-01: 1000 mg via ORAL
  Filled 2024-07-01: qty 2

## 2024-07-01 MED ORDER — ENOXAPARIN SODIUM 120 MG/0.8ML IJ SOSY
110.0000 mg | PREFILLED_SYRINGE | INTRAMUSCULAR | Status: AC
  Administered 2024-07-01: 110 mg via SUBCUTANEOUS
  Filled 2024-07-01: qty 0.74

## 2024-07-01 MED ORDER — ENOXAPARIN SODIUM 120 MG/0.8ML IJ SOSY
110.0000 mg | PREFILLED_SYRINGE | Freq: Once | INTRAMUSCULAR | Status: DC
  Filled 2024-07-01: qty 0.74

## 2024-07-01 NOTE — Assessment & Plan Note (Addendum)
 33 year old female presenting to ED with complaints of progressive shortness of breath and dyspnea on exertion found to have 2 small subsegmental emobli over the right lower lobar pulmonary arteries.  -obs to progressive -BNP wnl, troponin mildly elevated but flat with reassuring EKG -check echo and dopplers -start heparin, SW consult for DOAC  -lovenox  per pharmacy suggestion  -no provoking event  -check lupus anticoagulation studies and other studies if can be done before anticoagulation

## 2024-07-01 NOTE — Assessment & Plan Note (Addendum)
-  Leukocytosis and tachypnea in setting of PE meeting SIRS, but no source or other indication of infectious cause -CXR clear, CTA showing nodular airspace process over the LLL. Infectious vs. Inflammatory. Trend PCT and fever curve  -check UA -repeat CT chest in 4-6 weeks, discussed with patient

## 2024-07-01 NOTE — H&P (Signed)
 History and Physical    Patient: Becky Gomez FMW:[id number] DOB: 21-Sep-1991 DOA: 07/01/2024 DOS: the patient was seen and examined on 07/01/2024 PCP: Patient, No Pcp Per  Patient coming from: Home - lives with husband and 3 kids.    Chief Complaint: progressive shortness of breath/DOE  HPI: Becky Gomez is a 33 y.o. female with medical history significant of bipolar, depression and and anxiety  who presented to ED with complaints of shortness of breath and dyspnea on exertion x 3 weeks. Monday she went to a waterpark with 6 kids and did a lot of walking. She was walking up steps and couldn't do it. She got light headed, dizzy and felt like she couldn't breath. It took her 45 minutes to an hour to move from that spot. She took it easy the rest of the day. She stayed in bed on Wednesday. She went back to water park on Thursday, but still couldn't catch her breath with walking. She had to sleep sitting up Friday night. Yesterday she couldn't even walk to the bathroom without getting short of breath. This morning she walked up 10 steps and thought she was going to faint due to shortness of breath. Pre syncopal with dark spots in her vision and just feel like she couldn't catch her breath which prompted her to come to ED. She also has a metallic taste in her mouth in the mornings.   She has had a cough x 3 weeks as well that started with her symptoms. It is dry and sounds like supra glottic wheeze. She has some leg swelling in both legs, not sure if this is new. She denies any chest pain or palpitations. She has orthopnea.   Denies any fever/chills, vision changes/headaches, chest pain or palpitations, abdominal pain, she endorses some nausea, but no V/D, dysuria.   She was adopted but no known clotting disorders. She has not traveled, does not smoke, no OCP, no recent vaccines. 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    She does not smoke or drink alcohol.   ER Course:  vitals: afebrile, bp:  119/84, HR: 78, RR: 20, oxygen : 97%RA Pertinent labs: wbc: 13.2, d-dimer: 1.05, troponin 58>52, calcium: 10.5  CXR: no acute finding CtA chest:  Evidence of 2 small subsegmental emboli over the right lower lobar pulmonary arteries. 2. Nodular airspace process over the left lower lobe with the largest focus measuring 2.3 cm. Findings may be due to an infectious or inflammatory process. Recommend follow-up CT 4-6 weeks. 3. Posterior bibasilar dependent opacification likely atelectasis, although could not exclude infection. 4. 1.1 cm right hilar lymph node likely reactive. In ED: given tylenol . TRH asked to admit.     Review of Systems: As mentioned in the history of present illness. All other systems reviewed and are negative. Past Medical History:  Diagnosis Date   Asthma    Bronchitis    Depression    Headache    Hypoglycemia    Past Surgical History:  Procedure Laterality Date   CHOLECYSTECTOMY     Social History:  reports that she has never smoked. She has never used smokeless tobacco. She reports that she does not drink alcohol and does not use drugs.  Allergies  Allergen Reactions   Penicillins Nausea And Vomiting    Has patient had a PCN reaction causing immediate rash, facial/tongue/throat swelling, SOB or lightheadedness with hypotension: no Has patient had a PCN reaction causing severe rash involving mucus membranes or skin necrosis: no Has patient had  a PCN reaction that required hospitalization: no Has patient had a PCN reaction occurring within the last 10 years: no If all of the above answers are NO, then may proceed with Cephalosporin use.    Pineapple Hives    History reviewed. No pertinent family history.  Prior to Admission medications   Medication Sig Start Date End Date Taking? Authorizing Provider  OXcarbazepine  (TRILEPTAL ) 150 MG tablet Take 1 tablet (150 mg total) by mouth 2 (two) times daily. 07/22/22   Nwoko, Uchenna E, PA  Prenatal Vit-Fe  Fumarate-FA (PRENATAL MULTIVITAMIN) TABS tablet Take 1 tablet by mouth daily at 12 noon.    [provider]  QUEtiapine  (SEROQUEL ) 100 MG tablet Take 0.5 tablets (50 mg total) by mouth at bedtime for 4 days, THEN 1 tablet (100 mg total) at bedtime. 07/22/22 07/26/23  Collene Reginia BRAVO, PA    Physical Exam: Vitals:   07/01/24 0930 07/01/24 1236 07/01/24 1328 07/01/24 1700  BP: 119/84 110/76  117/72  Pulse: 78 70  66  Resp: 20 (!) 21  19  Temp: 98.2 F (36.8 C)  98.1 F (36.7 C) 98.3 F (36.8 C)  TempSrc: Oral  Oral Oral  SpO2: 97% 99%  100%  Weight:      Height:       General:  Appears calm and comfortable and is in NAD Eyes:  PERRL, EOMI, normal lids, iris ENT:  grossly normal hearing, lips & tongue, mmm; appropriate dentition Neck:  no LAD, masses or thyromegaly; no carotid bruits Cardiovascular:  RRR, no m/r/g. No LE edema.  Respiratory:   CTA bilaterally with no wheezes/rales/rhonchi.  Normal respiratory effort. Abdomen:  soft, NT, ND, NABS Back:   normal alignment, no CVAT Skin:  no rash or induration seen on limited exam Musculoskeletal:  grossly normal tone BUE/BLE, good ROM, no bony abnormality Lower extremity:  No LE edema.  Limited foot exam with no ulcerations.  2+ distal pulses. Psychiatric:  grossly normal mood and affect, speech fluent and appropriate, AOx3 Neurologic:  CN 2-12 grossly intact, moves all extremities in coordinated fashion, sensation intact   Radiological Exams on Admission: Independently reviewed - see discussion in A/P where applicable  CT Angio Chest PE W and/or Wo Contrast Result Date: 07/01/2024 CLINICAL DATA:  Shortness of breath with dizziness and congestion. Positive D-dimer. Possible pulmonary embolus. EXAM: CT ANGIOGRAPHY CHEST WITH CONTRAST TECHNIQUE: Multidetector CT imaging of the chest was performed using the standard protocol during bolus administration of intravenous contrast. Multiplanar CT image reconstructions and MIPs were  obtained to evaluate the vascular anatomy. RADIATION DOSE REDUCTION: This exam was performed according to the departmental dose-optimization program which includes automated exposure control, adjustment of the mA and/or kV according to patient size and/or use of iterative reconstruction technique. CONTRAST:  75mL OMNIPAQUE  IOHEXOL  350 MG/ML SOLN COMPARISON:  Chest x-ray today. FINDINGS: Cardiovascular: Heart is normal size. Thoracic aorta is normal in caliber. Pulmonary arterial system is well opacified. Evidence of 2 small subsegmental emboli over the right lower lobar pulmonary arteries. Remaining vascular structures are unremarkable. Mediastinum/Nodes: 1.1 cm right hilar lymph node likely reactive. No significant mediastinal or left hilar adenopathy. Remaining mediastinal structures are unremarkable. Lungs/Pleura: Lungs are adequately inflated. There is posterior bibasilar dependent opacification likely atelectasis although could not exclude infection. Calcified granuloma over the left upper lobe. Nodular airspace process over the left lower lobe with the largest focus measuring 2.3 cm. Findings may be due to an infectious or inflammatory process. No effusion. Airways are normal. Upper  Abdomen: Previous cholecystectomy.  No acute findings. Musculoskeletal: No focal abnormality. Review of the MIP images confirms the above findings. IMPRESSION: 1. Evidence of 2 small subsegmental emboli over the right lower lobar pulmonary arteries. 2. Nodular airspace process over the left lower lobe with the largest focus measuring 2.3 cm. Findings may be due to an infectious or inflammatory process. Recommend follow-up CT 4-6 weeks. 3. Posterior bibasilar dependent opacification likely atelectasis, although could not exclude infection. 4. 1.1 cm right hilar lymph node likely reactive. Critical Value/emergent results were called by telephone at the time of interpretation on 07/01/2024 at 2:30 pm to provider [facility] , who  verbally acknowledged these results. Electronically Signed   By: Toribio Agreste M.D.   On: 07/01/2024 14:30   DG Chest 2 View Result Date: 07/01/2024 CLINICAL DATA:  SOB.  Dyspnea on exertion. EXAM: CHEST - 2 VIEW COMPARISON:  None Available. FINDINGS: Bilateral lung fields are clear. Bilateral costophrenic angles are clear. Normal cardio-mediastinal silhouette. No acute osseous abnormalities. The soft tissues are within normal limits. IMPRESSION: No active cardiopulmonary disease. Electronically Signed   By: Ree Molt M.D.   On: 07/01/2024 10:42    EKG: Independently reviewed.  NSR with rate 68; nonspecific ST changes with no evidence of acute ischemia   Labs on Admission: I have personally reviewed the available labs and imaging studies at the time of the admission.  Pertinent labs:   wbc: 13.2,  d-dimer: 1.05,  troponin 58>52,  calcium: 10.5   Assessment and Plan: Principal Problem:   Pulmonary embolus (HCC) Active Problems:   Elevated troponin   Leukocytosis   Hypercalcemia   Metallic taste   Snoring    Assessment and Plan: * Pulmonary embolus (HCC) 33 year old female presenting to ED with complaints of progressive shortness of breath and dyspnea on exertion found to have 2 small subsegmental emobli over the right lower lobar pulmonary arteries.  -obs to progressive -BNP wnl, troponin mildly elevated but flat with reassuring EKG -check echo and dopplers -start heparin, SW consult for DOAC  -lovenox  per pharmacy suggestion  -no provoking event  -check lupus anticoagulation studies and other studies if can be done before anticoagulation   Elevated troponin Troponin flat 58>52, EKG with no acute changes  She does have PE,  unsure of baseline troponin Reports cardiac arrest in remote past, but no records to follow up on this  BNP wnl  Tele monitoring  echo pending   Leukocytosis -Leukocytosis and tachypnea in setting of PE meeting SIRS, but no source or other  indication of infectious cause -CXR clear, CTA showing nodular airspace process over the LLL. Infectious vs. Inflammatory. Trend PCT and fever curve  -check UA -repeat CT chest in 4-6 weeks, discussed with patient    Hypercalcemia Presenting with calcium of 10.5 Trend  W/u if continues to be elevated   Metallic taste Possible GERD Start famotidine    Snoring Sleep apnea work up outpatient      Advance Care Planning:   Code Status: Full Code   Consults: SW  DVT Prophylaxis: lovenox  treatment   Family Communication: husband and mother at beside   Severity of Illness: The appropriate patient status for this patient is OBSERVATION. Observation status is judged to be reasonable and necessary in order to provide the required intensity of service to ensure the patient's safety. The patient's presenting symptoms, physical exam findings, and initial radiographic and laboratory data in the context of their medical condition is felt to place them  at decreased risk for further clinical deterioration. Furthermore, it is anticipated that the patient will be medically stable for discharge from the hospital within 2 midnights of admission.   Author: Isaiah Geralds, MD 07/01/2024 5:31 PM  For on call review [email or web address].

## 2024-07-01 NOTE — Assessment & Plan Note (Addendum)
 Troponin flat 58>52, EKG with no acute changes  She does have PE,  unsure of baseline troponin Reports cardiac arrest in remote past, but no records to follow up on this  BNP wnl  Tele monitoring  echo pending

## 2024-07-01 NOTE — ED Notes (Signed)
 Pt ambulated with NT to end of hallway and back. O2 saturation remained at 97% and above on room air throughout ambulation activity.

## 2024-07-01 NOTE — ED Provider Notes (Signed)
 [place] EMERGENCY DEPARTMENT AT [facility] Provider Note   CSN: [id number] Arrival date & time: 07/01/24  9088     Patient presents with: No chief complaint on file.   Becky Gomez is a 33 y.o. female.   33 y/o female with a hx of cardiac arrest (self reported), asthma (pregnancy induced), and depression who presents to the emergency department for shortness of breath.  She states that shortness of breath has become progressive over the past 3 weeks.  She was initially told by her spouse that she was snoring more at nighttime and she would wake with a metallic taste in her mouth as well as a persistent cough productive of phlegm.  Has become progressively orthopneic, now needing to sleep upright or significantly inclined. Becomes SOB when fully supine. Has been unable to walk short distances without profound SOB and lightheadedness. Saw spots in her vision when walking up 10 steps at home this morning. Does endorse some BLE swelling. No fever, recent viral illness, syncope, chest pain, N/V, bleeding concerns. No hx of postpartum cardiomyopathy, per patient. Denies OCP use, hemoptysis, prolonged travel, recent surgeries or hospitalizations.  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 Subsequently had another episode of cardiac arrest in the 11th grade while swimming. Discontinued sports at time time and was reportedly seen by cardiology without any clear diagnosis aside from trivial heart murmur. No actively followed by a PCP or cardiologist.  The history is provided by the patient. No language interpreter was used.       Prior to Admission medications   Medication Sig Start Date End Date Taking? Authorizing Provider  OXcarbazepine  (TRILEPTAL ) 150 MG tablet Take 1 tablet (150 mg total) by mouth 2 (two) times daily. 07/22/22   Nwoko, Uchenna E, PA  Prenatal Vit-Fe Fumarate-FA (PRENATAL MULTIVITAMIN) TABS tablet Take  1 tablet by mouth daily at 12 noon.    [provider]  QUEtiapine  (SEROQUEL ) 100 MG tablet Take 0.5 tablets (50 mg total) by mouth at bedtime for 4 days, THEN 1 tablet (100 mg total) at bedtime. 07/22/22 07/26/23  Nwoko, Uchenna E, PA    Allergies: Penicillins    Review of Systems Ten systems reviewed and are negative for acute change, except as noted in the HPI.    Updated Vital Signs BP 110/76   Pulse 70   Temp 98.1 F (36.7 C) (Oral)   Resp (!) 21   Ht 5' 6 (1.676 m)   Wt 106.6 kg   LMP 06/30/2024   SpO2 99%   BMI 37.93 kg/m   Physical Exam Vitals and nursing note reviewed.  Constitutional:      General: She is not in acute distress.    Appearance: She is well-developed. She is not diaphoretic.     Comments: Obese, pleasant female.  HENT:     Head: Normocephalic and atraumatic.  Eyes:     General: No scleral icterus.    Conjunctiva/sclera: Conjunctivae normal.  Cardiovascular:     Rate and Rhythm: Normal rate and regular rhythm.     Pulses: Normal pulses.  Pulmonary:     Effort: Pulmonary effort is normal. No respiratory distress.     Breath sounds: No stridor. No wheezing.     Comments: Respirations even and unlabored. Decreased breath sounds b/l bases. No wheezing or rhonchi. Musculoskeletal:        General: Normal range of motion.     Cervical  back: Normal range of motion.     Comments: No pitting BLE edema  Skin:    General: Skin is warm and dry.     Coloration: Skin is not pale.     Findings: No erythema or rash.  Neurological:     Mental Status: She is alert and oriented to person, place, and time.     Coordination: Coordination normal.  Psychiatric:        Behavior: Behavior normal.     (all labs ordered are listed, but only abnormal results are displayed) Labs Reviewed  CBC WITH DIFFERENTIAL/PLATELET - Abnormal; Notable for the following components:      Result Value   WBC 13.2 (*)    Eosinophils Absolute 3.9 (*)    Abs Immature  Granulocytes 0.09 (*)    All other components within normal limits  COMPREHENSIVE METABOLIC PANEL WITH GFR - Abnormal; Notable for the following components:   Calcium 10.5 (*)    All other components within normal limits  D-DIMER, QUANTITATIVE - Abnormal; Notable for the following components:   D-Dimer, Quant 1.05 (*)    All other components within normal limits  TROPONIN I (HIGH SENSITIVITY) - Abnormal; Notable for the following components:   Troponin I (High Sensitivity) 58 (*)    All other components within normal limits  TROPONIN I (HIGH SENSITIVITY) - Abnormal; Notable for the following components:   Troponin I (High Sensitivity) 52 (*)    All other components within normal limits  BRAIN NATRIURETIC PEPTIDE  HCG, SERUM, QUALITATIVE  PATHOLOGIST SMEAR REVIEW  PROCALCITONIN    EKG: EKG Interpretation Date/Time:  Sunday July 01 2024 10:28:52 EDT Ventricular Rate:  68 PR Interval:  180 QRS Duration:  88 QT Interval:  402 QTC Calculation: 428 R Axis:   73  Text Interpretation: Sinus rhythm Confirmed by Simon Rea [phone]) on 07/01/2024 10:42:59 AM  Radiology: CT Angio Chest PE W and/or Wo Contrast Result Date: 07/01/2024 CLINICAL DATA:  Shortness of breath with dizziness and congestion. Positive D-dimer. Possible pulmonary embolus. EXAM: CT ANGIOGRAPHY CHEST WITH CONTRAST TECHNIQUE: Multidetector CT imaging of the chest was performed using the standard protocol during bolus administration of intravenous contrast. Multiplanar CT image reconstructions and MIPs were obtained to evaluate the vascular anatomy. RADIATION DOSE REDUCTION: This exam was performed according to the departmental dose-optimization program which includes automated exposure control, adjustment of the mA and/or kV according to patient size and/or use of iterative reconstruction technique. CONTRAST:  75mL OMNIPAQUE  IOHEXOL  350 MG/ML SOLN COMPARISON:  Chest x-ray today. FINDINGS: Cardiovascular: Heart is normal size.  Thoracic aorta is normal in caliber. Pulmonary arterial system is well opacified. Evidence of 2 small subsegmental emboli over the right lower lobar pulmonary arteries. Remaining vascular structures are unremarkable. Mediastinum/Nodes: 1.1 cm right hilar lymph node likely reactive. No significant mediastinal or left hilar adenopathy. Remaining mediastinal structures are unremarkable. Lungs/Pleura: Lungs are adequately inflated. There is posterior bibasilar dependent opacification likely atelectasis although could not exclude infection. Calcified granuloma over the left upper lobe. Nodular airspace process over the left lower lobe with the largest focus measuring 2.3 cm. Findings may be due to an infectious or inflammatory process. No effusion. Airways are normal. Upper Abdomen: Previous cholecystectomy.  No acute findings. Musculoskeletal: No focal abnormality. Review of the MIP images confirms the above findings. IMPRESSION: 1. Evidence of 2 small subsegmental emboli over the right lower lobar pulmonary arteries. 2. Nodular airspace process over the left lower lobe with the largest focus measuring 2.3 cm. Findings  may be due to an infectious or inflammatory process. Recommend follow-up CT 4-6 weeks. 3. Posterior bibasilar dependent opacification likely atelectasis, although could not exclude infection. 4. 1.1 cm right hilar lymph node likely reactive. Critical Value/emergent results were called by telephone at the time of interpretation on 07/01/2024 at 2:30 pm to provider [facility] , who verbally acknowledged these results. Electronically Signed   By: Toribio Agreste M.D.   On: 07/01/2024 14:30   DG Chest 2 View Result Date: 07/01/2024 CLINICAL DATA:  SOB.  Dyspnea on exertion. EXAM: CHEST - 2 VIEW COMPARISON:  None Available. FINDINGS: Bilateral lung fields are clear. Bilateral costophrenic angles are clear. Normal cardio-mediastinal silhouette. No acute osseous abnormalities. The soft tissues are within normal  limits. IMPRESSION: No active cardiopulmonary disease. Electronically Signed   By: Ree Molt M.D.   On: 07/01/2024 10:42     Procedures   Medications Ordered in the ED  iohexol  (OMNIPAQUE ) 350 MG/ML injection 75 mL (75 mLs Intravenous Contrast Given 07/01/24 1347)  acetaminophen  (TYLENOL ) tablet 1,000 mg (1,000 mg Oral Given 07/01/24 1519)    Clinical Course as of 07/01/24 1542  Sun Jul 01, 2024  1211 Did perform bedside ultrasound and did not seem to note any significant decrease in contractility.  No obvious pericardial effusion.  No concern for tamponade.  Upon review of the patient's chest x-ray, there is no significant evidence of pulmonary edema.  No pneumothorax, pleural effusion.  Heart size appears normal with appropriate mediastinal contours.  Patient does have an initial troponin of 58.  While this elevation is nonspecific, may be related to underlying cardiomyopathy or heart failure.  Unable to exclude myocarditis, though no history of recent infection or other viral illness.  BNP is presently pending.  Pulmonary embolus not entirely excluded, though patient has no tachycardia or hypoxia at rest.  If BNP normal, would consider D dimer.  Overall low risk for PE. [KH]  1531 Work up has revealed two subsegmental right PE's. No signs of RHS on CT scan. Unclear relation to elevated troponin level. Delta pending for trending. PESI score of 32 c/w low risk of mortality. However, reported history with this diagnosis and elevated troponin are cumulatively concerning. Will discuss inpatient observation with [facility]; may benefit from formal echocardiogram in AM. Will also discuss heparin vs DOACs with admitting service. Patient is presently uninsured; no outpatient PCP. [KH]  1537 Troponin I (High Sensitivity)(!): 52 [KH]  1542 Spoke with Dr. Waddell of [facility] who will assess the patient in the ED for admission. [KH]    Clinical Course User Index [KH] Keith Sor, PA-C                                  Medical Decision Making Amount and/or Complexity of Data Reviewed Labs: ordered. Radiology: ordered. ECG/medicine tests: ordered.  Risk OTC drugs. Prescription drug management.   This patient presents to the ED for concern of SOB and DOE, this involves an extensive number of treatment options, and is a complaint that carries with it a high risk of complications and morbidity.  The differential diagnosis includes CHF vs viral illness vs cardiomyopathy vs myocarditis vs tamponade vs PE vs PNA.   Co morbidities that complicate the patient evaluation  Asthma Depression   Additional history obtained:  Additional history obtained from family friend at bedside   Lab Tests:  I Ordered, and personally interpreted labs.  The pertinent results  include:  WBC 13.2 (nonspecific), D dimer 1.05, Troponin 58>52.   Imaging Studies ordered:  I ordered imaging studies including CXR and CTA  I independently visualized and interpreted imaging which showed 2 subsegmental right pulmonary emboli with nodular airspace process in the left lower lobe I agree with the radiologist interpretation   Cardiac Monitoring:  The patient was maintained on a cardiac monitor.  I personally viewed and interpreted the cardiac monitored which showed an underlying rhythm of: NSR   Medicines ordered and prescription drug management:  I ordered medication including Tylenol  for headache  Reevaluation of the patient after these medicines showed that the patient remained stable I have reviewed the patients home medicines and have made adjustments as needed   Test Considered:  Hypercoagulable panel   Consultations Obtained:  I requested consultation with Dr. Waddell of [facility] and discussed lab and imaging findings as well as pertinent plan - they will evaluate the patient in the ED for observation admission.   Problem List / ED Course:  As above   Reevaluation:  After the interventions noted above, I  reevaluated the patient and found that they have :stayed the same   Social Determinants of Health:  Uninsured patient   Dispostion:  After consideration of the diagnostic results and the patients response to treatment, I feel that the patent would benefit from observation admission. Will need initiation on blood thinner; defer to [facility]. May consider echocardiogram in light of troponin elevation with stated cardiac history. Has been stable since ED arrival.      Final diagnoses:  Multiple subsegmental pulmonary emboli without acute cor pulmonale (HCC)  Elevated troponin I level  Shortness of breath    ED Discharge Orders     None          Keith Sor, PA-C 07/01/24 1547    Simon Lavonia SAILOR, MD 07/02/24 [phone]

## 2024-07-01 NOTE — Assessment & Plan Note (Addendum)
 Presenting with calcium of 10.5 Trend  W/u if continues to be elevated

## 2024-07-01 NOTE — Assessment & Plan Note (Signed)
 Sleep apnea work up outpatient

## 2024-07-01 NOTE — Assessment & Plan Note (Signed)
 Possible GERD Start famotidine 

## 2024-07-01 NOTE — ED Triage Notes (Addendum)
 Here by POV from home for sob with associated: DOE, dizziness with exertion, congestion, snoring, metallic taste, orthopneic, exercise intolerance, cough with some mucus. Gradually progressively worse over last 3 weeks. Some nausea. Denies fever, syncope, NV, bleeding. Some chest pain intermittently when lying flat. Alert, NAD, calm, steady gait. LMP yesterday. Resps e/u, speaking in clear complete sentences.

## 2024-07-01 NOTE — ED Notes (Signed)
 Patient transported to CT

## 2024-07-02 ENCOUNTER — Observation Stay (HOSPITAL_BASED_OUTPATIENT_CLINIC_OR_DEPARTMENT_OTHER): Payer: Self-pay

## 2024-07-02 DIAGNOSIS — Z86711 Personal history of pulmonary embolism: Secondary | ICD-10-CM

## 2024-07-02 DIAGNOSIS — I2694 Multiple subsegmental pulmonary emboli without acute cor pulmonale: Secondary | ICD-10-CM

## 2024-07-02 DIAGNOSIS — R7989 Other specified abnormal findings of blood chemistry: Secondary | ICD-10-CM

## 2024-07-02 DIAGNOSIS — R438 Other disturbances of smell and taste: Secondary | ICD-10-CM

## 2024-07-02 DIAGNOSIS — D72829 Elevated white blood cell count, unspecified: Secondary | ICD-10-CM

## 2024-07-02 LAB — ECHOCARDIOGRAM COMPLETE
Area-P 1/2: 3.39 cm2
Height: 66 in
S' Lateral: 2.7 cm
Weight: 3760 [oz_av]

## 2024-07-02 LAB — CBC
HCT: 41 % (ref 36.0–46.0)
Hemoglobin: 13.2 g/dL (ref 12.0–15.0)
MCH: 28.1 pg (ref 26.0–34.0)
MCHC: 32.2 g/dL (ref 30.0–36.0)
MCV: 87.4 fL (ref 80.0–100.0)
Platelets: 307 K/uL (ref 150–400)
RBC: 4.69 MIL/uL (ref 3.87–5.11)
RDW: 14.1 % (ref 11.5–15.5)
WBC: 14.4 K/uL — ABNORMAL HIGH (ref 4.0–10.5)
nRBC: 0 % (ref 0.0–0.2)

## 2024-07-02 LAB — BASIC METABOLIC PANEL WITH GFR
Anion gap: 9 (ref 5–15)
BUN: 8 mg/dL (ref 6–20)
CO2: 25 mmol/L (ref 22–32)
Calcium: 10 mg/dL (ref 8.9–10.3)
Chloride: 107 mmol/L (ref 98–111)
Creatinine, Ser: 0.76 mg/dL (ref 0.44–1.00)
GFR, Estimated: >60 mL/min (ref >60)
Glucose, Bld: 88 mg/dL (ref 70–99)
Potassium: 3.6 mmol/L (ref 3.5–5.1)
Sodium: 141 mmol/L (ref 135–145)

## 2024-07-02 LAB — LUPUS ANTICOAGULANT PANEL

## 2024-07-02 LAB — PROCALCITONIN: Procalcitonin: <0.1 ng/mL

## 2024-07-02 LAB — PATHOLOGIST SMEAR REVIEW

## 2024-07-02 MED ORDER — KETOROLAC TROMETHAMINE 30 MG/ML IJ SOLN
30.0000 mg | Freq: Four times a day (QID) | INTRAMUSCULAR | Status: DC | PRN
  Administered 2024-07-02 – 2024-07-04 (×4): 30 mg via INTRAVENOUS
  Filled 2024-07-02 (×4): qty 1

## 2024-07-02 NOTE — Progress Notes (Incomplete)
 PROGRESS NOTE    [name]  FMW:[id number] DOB: August 15, 1991 DOA: 07/01/2024 PCP: Patient, No Pcp Per (Confirm with patient/family/NH records and if not entered, this HAS to be entered at [facility] point of entry. No PCP if truly none.)   No chief complaint on file.   Brief Narrative: (Start on day 1 of progress note - keep it brief and live) ***   Assessment & Plan:   Principal Problem:   Pulmonary embolus (HCC) Active Problems:   Elevated troponin   Leukocytosis   Hypercalcemia   Metallic taste   Snoring   ***   DVT prophylaxis: (Lovenox /Heparin/SCD's/anticoagulated/None (if comfort care) Code Status: (Full/Partial - specify details) Family Communication: (Specify name, relationship & date discussed. NO discussed with patient) Disposition:   Status is: Observation {Observation:23811}   Consultants:  ***  Procedures: (Don't include imaging studies which can be auto populated. Include things that cannot be auto populated i.e. Echo, Carotid and venous dopplers, Foley, Bipap, HD, tubes/drains, wound vac, central lines etc) ***  Antimicrobials: (specify start and planned stop date. Auto populated tables are space occupying and do not give end dates) ***    Subjective: ***  Objective: Vitals:   07/01/24 1700 07/01/24 2102 07/02/24 0137 07/02/24 0543  BP: 117/72 128/78 107/71 122/73  Pulse: 66 62 60 63  Resp: 19 18  17   Temp: 98.3 F (36.8 C) 98.2 F (36.8 C) 98.4 F (36.9 C) 97.9 F (36.6 C)  TempSrc: Oral Oral Oral Oral  SpO2: 100% 99% 100% 100%  Weight:      Height:        Intake/Output Summary (Last 24 hours) at 07/02/2024 1233 Last data filed at 07/01/2024 1900 Gross per 24 hour  Intake 240 ml  Output --  Net 240 ml   Filed Weights   07/01/24 0927  Weight: 106.6 kg    Examination:  General exam: Appears calm and comfortable  Respiratory system: Clear to auscultation. Respiratory effort normal. Cardiovascular system: S1 & S2 heard, RRR.  No JVD, murmurs, rubs, gallops or clicks. No pedal edema. Gastrointestinal system: Abdomen is nondistended, soft and nontender. No organomegaly or masses felt. Normal bowel sounds heard. Central nervous system: Alert and oriented. No focal neurological deficits. Extremities: Symmetric 5 x 5 power. Skin: No rashes, lesions or ulcers Psychiatry: Judgement and insight appear normal. Mood & affect appropriate.     Data Reviewed: I have personally reviewed following labs and imaging studies  CBC: Recent Labs  Lab 07/01/24 1059 07/02/24 0348  WBC 13.2* 14.4*  NEUTROABS 5.2  --   HGB 13.6 13.2  HCT 42.9 41.0  MCV 86.8 87.4  PLT 331 307    Basic Metabolic Panel: Recent Labs  Lab 07/01/24 1059 07/01/24 1728 07/02/24 0348  NA 142  --  141  K 4.1  --  3.6  CL 110  --  107  CO2 23  --  25  GLUCOSE 95  --  88  BUN 7  --  8  CREATININE 0.77  --  0.76  CALCIUM 10.5*  --  10.0  MG  --  2.1  --     GFR: Estimated Creatinine Clearance: 124.6 mL/min (by C-G formula based on SCr of 0.76 mg/dL).  Liver Function Tests: Recent Labs  Lab 07/01/24 1059  AST 25  ALT 33  ALKPHOS 90  BILITOT 0.6  PROT 7.1  ALBUMIN 3.8    CBG: No results for input(s): GLUCAP in the last 168 hours.   No  results found for this or any previous visit (from the past 240 hours).       Radiology Studies: VAS US  LOWER EXTREMITY VENOUS (DVT) Result Date: 07/02/2024  Lower Venous DVT Study [name]  Date of Exam:   07/02/2024 Medical Rec #: [id number]          Accession #:    [id number] Date of Birth: December 17, 1990           Patient Gender: F Patient Age:   33 years Exam Location:  [facility] Procedure:      VAS US  LOWER EXTREMITY VENOUS (DVT) Referring Phys: ISAIAH GERALDS --------------------------------------------------------------------------------  Indications: Pulmonary embolism.  Risk Factors: Confirmed PE. Comparison Study: No prior studies. Performing Technologist:  Cordella Collet RVT  Examination Guidelines: A complete evaluation includes B-mode imaging, spectral Doppler, color Doppler, and power Doppler as needed of all accessible portions of each vessel. Bilateral testing is considered an integral part of a complete examination. Limited examinations for reoccurring indications may be performed as noted. The reflux portion of the exam is performed with the patient in reverse Trendelenburg.  +---------+---------------+---------+-----------+----------+--------------+ RIGHT    CompressibilityPhasicitySpontaneityPropertiesThrombus Aging +---------+---------------+---------+-----------+----------+--------------+ CFV      Full           Yes      Yes                                 +---------+---------------+---------+-----------+----------+--------------+ SFJ      Full                                                        +---------+---------------+---------+-----------+----------+--------------+ FV Prox  Full                                                        +---------+---------------+---------+-----------+----------+--------------+ FV Mid   Full                                                        +---------+---------------+---------+-----------+----------+--------------+ FV DistalFull                                                        +---------+---------------+---------+-----------+----------+--------------+ PFV      Full                                                        +---------+---------------+---------+-----------+----------+--------------+ POP      Full           Yes      Yes                                 +---------+---------------+---------+-----------+----------+--------------+  PTV      Full                                                        +---------+---------------+---------+-----------+----------+--------------+ PERO     Full                                                         +---------+---------------+---------+-----------+----------+--------------+   +---------+---------------+---------+-----------+----------+--------------+ LEFT     CompressibilityPhasicitySpontaneityPropertiesThrombus Aging +---------+---------------+---------+-----------+----------+--------------+ CFV      Full           Yes      Yes                                 +---------+---------------+---------+-----------+----------+--------------+ SFJ      Full                                                        +---------+---------------+---------+-----------+----------+--------------+ FV Prox  Full                                                        +---------+---------------+---------+-----------+----------+--------------+ FV Mid   Full                                                        +---------+---------------+---------+-----------+----------+--------------+ FV DistalFull                                                        +---------+---------------+---------+-----------+----------+--------------+ PFV      Full                                                        +---------+---------------+---------+-----------+----------+--------------+ POP      Full           Yes      Yes                                 +---------+---------------+---------+-----------+----------+--------------+ PTV      Full                                                        +---------+---------------+---------+-----------+----------+--------------+  PERO     Full                                                        +---------+---------------+---------+-----------+----------+--------------+     Summary: RIGHT: - There is no evidence of deep vein thrombosis in the lower extremity.  - No cystic structure found in the popliteal fossa.  LEFT: - There is no evidence of deep vein thrombosis in the lower extremity.  - No cystic structure found in the popliteal  fossa.  *See table(s) above for measurements and observations.    Preliminary    ECHOCARDIOGRAM COMPLETE Result Date: 07/02/2024    ECHOCARDIOGRAM REPORT   Patient Name:   SEBASTIAN DZIK Date of Exam: 07/02/2024 Medical Rec #:  [id number]         Height:       66.0 in Accession #:    [id number]        Weight:       235.0 lb Date of Birth:  18-Jul-1991          BSA:          2.142 m Patient Age:    33 years          BP:           122/73 mmHg Patient Gender: F                 HR:           63 bpm. Exam Location:  Inpatient Procedure: 2D Echo, Cardiac Doppler and Color Doppler (Both Spectral and Color            Flow Doppler were utilized during procedure). Indications:    Pulmonary Embolus  History:        Patient has no prior history of Echocardiogram examinations.  Sonographer:    Philomena Daring Referring Phys: [id number] ALLISON WOLFE IMPRESSIONS  1. Left ventricular ejection fraction, by estimation, is 60 to 65%. The left ventricle has normal function. The left ventricle has no regional wall motion abnormalities. Left ventricular diastolic parameters were normal.  2. Right ventricular systolic function is normal. The right ventricular size is normal. Tricuspid regurgitation signal is inadequate for assessing PA pressure. Normal PAT (>129ms)  3. The mitral valve is normal in structure. No evidence of mitral valve regurgitation. No evidence of mitral stenosis.  4. The aortic valve is tricuspid. Aortic valve regurgitation is not visualized. No aortic stenosis is present.  5. The inferior vena cava is normal in size with greater than 50% respiratory variability, suggesting right atrial pressure of 3 mmHg. FINDINGS  Left Ventricle: Left ventricular ejection fraction, by estimation, is 60 to 65%. The left ventricle has normal function. The left ventricle has no regional wall motion abnormalities. The left ventricular internal cavity size was normal in size. There is  no left ventricular hypertrophy. Left ventricular  diastolic parameters were normal. Right Ventricle: The right ventricular size is normal. No increase in right ventricular wall thickness. Right ventricular systolic function is normal. Tricuspid regurgitation signal is inadequate for assessing PA pressure. Left Atrium: Left atrial size was normal in size. Right Atrium: Right atrial size was normal in size. Pericardium: There is no evidence of pericardial effusion. Mitral Valve: The mitral valve is normal in structure. No evidence of mitral valve regurgitation. No  evidence of mitral valve stenosis. Tricuspid Valve: The tricuspid valve is normal in structure. Tricuspid valve regurgitation is trivial. No evidence of tricuspid stenosis. Aortic Valve: The aortic valve is tricuspid. Aortic valve regurgitation is not visualized. No aortic stenosis is present. Pulmonic Valve: The pulmonic valve was normal in structure. Pulmonic valve regurgitation is trivial. No evidence of pulmonic stenosis. Aorta: The aortic root is normal in size and structure. Venous: The inferior vena cava is normal in size with greater than 50% respiratory variability, suggesting right atrial pressure of 3 mmHg. IAS/Shunts: No atrial level shunt detected by color flow Doppler.  LEFT VENTRICLE PLAX 2D LVIDd:         4.50 cm   Diastology LVIDs:         2.70 cm   LV e' medial:    8.81 cm/s LV PW:         0.90 cm   LV E/e' medial:  8.2 LV IVS:        0.80 cm   LV e' lateral:   11.70 cm/s LVOT diam:     2.10 cm   LV E/e' lateral: 6.2 LV SV:         79 LV SV Index:   37 LVOT Area:     3.46 cm  RIGHT VENTRICLE             IVC RV S prime:     10.80 cm/s  IVC diam: 1.80 cm TAPSE (M-mode): 2.1 cm LEFT ATRIUM             Index        RIGHT ATRIUM           Index LA diam:        3.30 cm 1.54 cm/m   RA Area:     13.70 cm LA Vol (A2C):   29.1 ml 13.59 ml/m  RA Volume:   30.90 ml  14.43 ml/m LA Vol (A4C):   24.7 ml 11.53 ml/m LA Biplane Vol: 28.1 ml 13.12 ml/m  AORTIC VALVE LVOT Vmax:   113.00 cm/s LVOT  Vmean:  70.400 cm/s LVOT VTI:    0.228 m  AORTA Ao Root diam: 2.90 cm Ao Asc diam:  2.60 cm MITRAL VALVE               TRICUSPID VALVE MV Area (PHT): 3.39 cm    TR Peak grad:   2.8 mmHg MV Decel Time: 224 msec    TR Vmax:        84.20 cm/s MV E velocity: 72.00 cm/s MV A velocity: 58.30 cm/s  SHUNTS MV E/A ratio:  1.23        Systemic VTI:  0.23 m                            Systemic Diam: 2.10 cm Morene Brownie Electronically signed by Morene Brownie Signature Date/Time: 07/02/2024/8:27:36 AM    Final    CT Angio Chest PE W and/or Wo Contrast Result Date: 07/01/2024 CLINICAL DATA:  Shortness of breath with dizziness and congestion. Positive D-dimer. Possible pulmonary embolus. EXAM: CT ANGIOGRAPHY CHEST WITH CONTRAST TECHNIQUE: Multidetector CT imaging of the chest was performed using the standard protocol during bolus administration of intravenous contrast. Multiplanar CT image reconstructions and MIPs were obtained to evaluate the vascular anatomy. RADIATION DOSE REDUCTION: This exam was performed according to the departmental dose-optimization program which includes automated exposure control, adjustment of the mA and/or  kV according to patient size and/or use of iterative reconstruction technique. CONTRAST:  75mL OMNIPAQUE  IOHEXOL  350 MG/ML SOLN COMPARISON:  Chest x-ray today. FINDINGS: Cardiovascular: Heart is normal size. Thoracic aorta is normal in caliber. Pulmonary arterial system is well opacified. Evidence of 2 small subsegmental emboli over the right lower lobar pulmonary arteries. Remaining vascular structures are unremarkable. Mediastinum/Nodes: 1.1 cm right hilar lymph node likely reactive. No significant mediastinal or left hilar adenopathy. Remaining mediastinal structures are unremarkable. Lungs/Pleura: Lungs are adequately inflated. There is posterior bibasilar dependent opacification likely atelectasis although could not exclude infection. Calcified granuloma over the left upper lobe. Nodular  airspace process over the left lower lobe with the largest focus measuring 2.3 cm. Findings may be due to an infectious or inflammatory process. No effusion. Airways are normal. Upper Abdomen: Previous cholecystectomy.  No acute findings. Musculoskeletal: No focal abnormality. Review of the MIP images confirms the above findings. IMPRESSION: 1. Evidence of 2 small subsegmental emboli over the right lower lobar pulmonary arteries. 2. Nodular airspace process over the left lower lobe with the largest focus measuring 2.3 cm. Findings may be due to an infectious or inflammatory process. Recommend follow-up CT 4-6 weeks. 3. Posterior bibasilar dependent opacification likely atelectasis, although could not exclude infection. 4. 1.1 cm right hilar lymph node likely reactive. Critical Value/emergent results were called by telephone at the time of interpretation on 07/01/2024 at 2:30 pm to provider [facility] , who verbally acknowledged these results. Electronically Signed   By: Toribio Agreste M.D.   On: 07/01/2024 14:30   DG Chest 2 View Result Date: 07/01/2024 CLINICAL DATA:  SOB.  Dyspnea on exertion. EXAM: CHEST - 2 VIEW COMPARISON:  None Available. FINDINGS: Bilateral lung fields are clear. Bilateral costophrenic angles are clear. Normal cardio-mediastinal silhouette. No acute osseous abnormalities. The soft tissues are within normal limits. IMPRESSION: No active cardiopulmonary disease. Electronically Signed   By: Ree Molt M.D.   On: 07/01/2024 10:42        Scheduled Meds:  enoxaparin  (LOVENOX ) injection  110 mg Subcutaneous Q12H   famotidine   20 mg Oral Daily   Continuous Infusions:   LOS: 0 days    Time spent: ***    Toribio Hummer, MD [facility]   To contact the attending provider between 7A-7P or the covering provider during after hours 7P-7A, please log into the web site [email or web address] and access using universal Bud password for that web site. If you do not have the  password, please call the hospital operator.  07/02/2024, 12:33 PM

## 2024-07-02 NOTE — TOC Initial Note (Signed)
 Transition of Care [facility]) - Initial/Assessment Note    Patient Details  Name: Becky Gomez MRN: [id number] Date of Birth: 1990/12/28  Transition of Care [facility]) CM/SW Contact:    Tawni CHRISTELLA Eva, LCSW Phone Number: 07/02/2024, 4:27 PM  Clinical Narrative:                 CSW spoke with the pt to discuss a consult for medication assistance and establishing a PCP. The pt reported being uninsured. She stated that she previously had Medicaid but was dropped last year. The pt shared that she has not seen a doctor in over seven years. She also expressed concern about the hospital bill and how she will afford her medications.  CSW explained that billing-related questions would need to be addressed by the Billing Department. CSW also informed the pt that she qualifies for the [facility] program, where medications are $3 per prescription. The pt verbalized that she is able to pay the copay.  CSW informed the pt that a PCP appointment can be arranged at a [facility], and that she can apply for financial assistance. The pt agreed to the appointment, will be made close to d/c. Care Management to follow  Expected Discharge Plan: Home/Self Care Barriers to Discharge: Continued Medical Work up   Patient Goals and CMS Choice Patient states their goals for this hospitalization and ongoing recovery are:: return home          Expected Discharge Plan and Services       Living arrangements for the past 2 months: Single Family Home                                      Prior Living Arrangements/Services Living arrangements for the past 2 months: Single Family Home Lives with:: Self, Minor Children, Spouse Patient language and need for interpreter reviewed:: Yes Do you feel safe going back to the place where you live?: Yes      Need for Family Participation in Patient Care: No (Comment) Care giver support system in place?: No (comment)   Criminal Activity/Legal Involvement Pertinent  to Current Situation/Hospitalization: No - Comment as needed  Activities of Daily Living   ADL Screening (condition at time of admission) Independently performs ADLs?: Yes (appropriate for developmental age) Is the patient deaf or have difficulty hearing?: No Does the patient have difficulty seeing, even when wearing glasses/contacts?: No Does the patient have difficulty concentrating, remembering, or making decisions?: Yes  Permission Sought/Granted                  Emotional Assessment       Orientation: : Oriented to Self, Oriented to Place, Oriented to  Time, Oriented to Situation      Admission diagnosis:  Shortness of breath [R06.02] Pulmonary embolus (HCC) [I26.99] Elevated troponin I level [R79.89] Multiple subsegmental pulmonary emboli without acute cor pulmonale (HCC) [I26.94] Patient Active Problem List   Diagnosis Date Noted   Pulmonary embolus (HCC) 07/01/2024   Elevated troponin 07/01/2024   Hypercalcemia 07/01/2024   Leukocytosis 07/01/2024   Anxiety and depression 07/01/2024   Metallic taste 07/01/2024   Snoring 07/01/2024   PCP:  Patient, No Pcp Per Pharmacy:   [id number] [address], [place] - [address] AT [facility] [address] [place] [address] Phone: [phone] Fax: [phone]  [id number] [place] [address] - [place], [place] -  [address] [facility] [address] AT [address] [place] [address] Phone: [phone] Fax: [phone]  [facility] [address] [place], [place] - [address] [place] AT [facility] [address] [place] [address] Phone: [phone] Fax: [phone]     Social Drivers of Health (SDOH) Social History: SDOH Screenings   Food Insecurity: No Food Insecurity (07/01/2024)  Housing: Low Risk  (07/01/2024)  Transportation Needs: No Transportation Needs (07/01/2024)  Utilities: Not At Risk (07/01/2024)  Depression (PHQ2-9): High Risk  (07/22/2022)  Social Connections: Unknown (04/02/2022)   Received from [facility]  Tobacco Use: Low Risk  (07/01/2024)   SDOH Interventions:     Readmission Risk Interventions     No data to display

## 2024-07-02 NOTE — Progress Notes (Signed)
 Bilateral lower extremity venous duplex has been completed. Preliminary results can be found in CV Proc through chart review.   07/02/24 9:03 AM Cathlyn Collet RVT

## 2024-07-02 NOTE — Plan of Care (Signed)
  Problem: Education: Goal: Knowledge of General Education information will improve Description: Including pain rating scale, medication(s)/side effects and non-pharmacologic comfort measures Outcome: Progressing   Problem: Clinical Measurements: Goal: Diagnostic test results will improve Outcome: Progressing Goal: Respiratory complications will improve Outcome: Progressing Goal: Cardiovascular complication will be avoided Outcome: Progressing   Problem: Activity: Goal: Risk for activity intolerance will decrease Outcome: Progressing   Problem: Nutrition: Goal: Adequate nutrition will be maintained Outcome: Progressing   Problem: Coping: Goal: Level of anxiety will decrease Outcome: Progressing   Problem: Pain Managment: Goal: General experience of comfort will improve and/or be controlled Outcome: Progressing

## 2024-07-02 NOTE — Progress Notes (Signed)
 PROGRESS NOTE    Becky Gomez  FMW:[id number] DOB: 10-26-1991 DOA: 07/01/2024 PCP: Patient, No Pcp Per   No chief complaint on file.   Brief Narrative:  Patient 33 year old female history of bipolar disorder, depression, anxiety who presented to the ED with a 3-week history of progressive shortness of breath and dyspnea on exertion found to have 2 small subsegmental emboli in the right lower lobe of the pulmonary arteries.  Patient admitted, placed on full dose anticoagulation.   Assessment & Plan:   Principal Problem:   Pulmonary embolus (HCC) Active Problems:   Elevated troponin   Leukocytosis   Hypercalcemia   Metallic taste   Snoring  #1 PE -Patient noted to have presented with progressive shortness of breath and dyspnea on exertion noted to have 2 small subsegmental emboli over the right lower lobe pulmonary arteries. -Unprovoked. -Factor V Leiden, prothrombin gene mutation, lupus anticoagulant panel pending.  -Patient noted prior history of miscarriages. - BNP noted to be within normal limits. - Troponin mildly elevated but flat and with no ischemic changes noted on EKG. - Lower extremity Dopplers negative for DVT. - 2D echo with EF of 60 to 65%, NWMA, normal right ventricular systolic function, normal right ventricular size. - Patient currently on full dose Lovenox  which we will continue for 48 hours and transition to oral DOAC. - Check ambulatory sats tomorrow.  2.  Elevated troponin -Troponin noted to be elevated and flat at 58>> 52, EKG with no ischemic changes noted. - Likely secondary to problem #1. - BNP within normal limits. -2D echo with EF of 60 to 65%, NWMA, normal right ventricular systolic function, normal right ventricular size. -Outpatient follow-up with PCP.  3.  Leukocytosis -Likely secondary to problem #1. - Chest x-ray negative for any acute infiltrate. - CT angiogram chest with some nodular airspace opacities over the left lower lobe. -  Procalcitonin negative, CRP 0.6. - Patient afebrile. - Urinalysis nitrite negative, leukocytes negative. - Monitor off antibiotics.  4.  Metallic taste -??  GERD - Continue Pepcid .  5.  Hypercalcemia -Improved. - Follow.   DVT prophylaxis: Lovenox  Code Status: Full Family Communication: Updated patient and mother at bedside. Disposition: Home when clinically improved and has been transition to DOAC.[name]  Status is: Observation The patient remains OBS appropriate and will d/c before 2 midnights.   Consultants:  None   Procedures:  CT angiogram chest 07/28/2024 Chest x-ray 07/01/2024 2D echo 07/02/2024 Lower extremity Dopplers 07/02/2024  Antimicrobials:  Anti-infectives (From admission, onward)    None         Subjective: Patient sitting up in bed.  Patient with some complaints of right-sided pain in the chest and neck region.  States has not ambulated or been out of bed to determine whether shortness of breath on exertion has improved.  Mother at bedside.  Objective: Vitals:   07/01/24 2102 07/02/24 0137 07/02/24 0543 07/02/24 1418  BP: 128/78 107/71 122/73 125/77  Pulse: 62 60 63 69  Resp: 18  17 16   Temp: 98.2 F (36.8 C) 98.4 F (36.9 C) 97.9 F (36.6 C) 98.2 F (36.8 C)  TempSrc: Oral Oral Oral   SpO2: 99% 100% 100% 99%  Weight:      Height:        Intake/Output Summary (Last 24 hours) at 07/02/2024 1638 Last data filed at 07/02/2024 0928 Gross per 24 hour  Intake 360 ml  Output --  Net 360 ml   Filed Weights   07/01/24 [phone]  Weight: 106.6 kg    Examination:  General exam: Appears calm and comfortable  Respiratory system: Clear to auscultation. Respiratory effort normal. Cardiovascular system: S1 & S2 heard, RRR. No JVD, murmurs, rubs, gallops or clicks. No pedal edema. Gastrointestinal system: Abdomen is nondistended, soft and nontender. No organomegaly or masses felt. Normal bowel sounds heard. Central nervous system: Alert and oriented. No focal  neurological deficits. Extremities: Symmetric 5 x 5 power. Skin: No rashes, lesions or ulcers Psychiatry: Judgement and insight appear normal. Mood & affect appropriate.     Data Reviewed: I have personally reviewed following labs and imaging studies  CBC: Recent Labs  Lab 07/01/24 1059 07/02/24 0348  WBC 13.2* 14.4*  NEUTROABS 5.2  --   HGB 13.6 13.2  HCT 42.9 41.0  MCV 86.8 87.4  PLT 331 307    Basic Metabolic Panel: Recent Labs  Lab 07/01/24 1059 07/01/24 1728 07/02/24 0348  NA 142  --  141  K 4.1  --  3.6  CL 110  --  107  CO2 23  --  25  GLUCOSE 95  --  88  BUN 7  --  8  CREATININE 0.77  --  0.76  CALCIUM 10.5*  --  10.0  MG  --  2.1  --     GFR: Estimated Creatinine Clearance: 124.6 mL/min (by C-G formula based on SCr of 0.76 mg/dL).  Liver Function Tests: Recent Labs  Lab 07/01/24 1059  AST 25  ALT 33  ALKPHOS 90  BILITOT 0.6  PROT 7.1  ALBUMIN 3.8    CBG: No results for input(s): GLUCAP in the last 168 hours.   No results found for this or any previous visit (from the past 240 hours).       Radiology Studies: VAS US  LOWER EXTREMITY VENOUS (DVT) Result Date: 07/02/2024  Lower Venous DVT Study [name]  Date of Exam:   07/02/2024 Medical Rec #: [id number]          Accession #:    [id number] Date of Birth: 1991/04/02           Patient Gender: F Patient Age:   24 years Exam Location:  [facility] Procedure:      VAS US  LOWER EXTREMITY VENOUS (DVT) Referring Phys: ISAIAH GERALDS --------------------------------------------------------------------------------  Indications: Pulmonary embolism.  Risk Factors: Confirmed PE. Comparison Study: No prior studies. Performing Technologist: Cordella Collet RVT  Examination Guidelines: A complete evaluation includes B-mode imaging, spectral Doppler, color Doppler, and power Doppler as needed of all accessible portions of each vessel. Bilateral testing is considered an integral part  of a complete examination. Limited examinations for reoccurring indications may be performed as noted. The reflux portion of the exam is performed with the patient in reverse Trendelenburg.  +---------+---------------+---------+-----------+----------+--------------+ RIGHT    CompressibilityPhasicitySpontaneityPropertiesThrombus Aging +---------+---------------+---------+-----------+----------+--------------+ CFV      Full           Yes      Yes                                 +---------+---------------+---------+-----------+----------+--------------+ SFJ      Full                                                        +---------+---------------+---------+-----------+----------+--------------+  FV Prox  Full                                                        +---------+---------------+---------+-----------+----------+--------------+ FV Mid   Full                                                        +---------+---------------+---------+-----------+----------+--------------+ FV DistalFull                                                        +---------+---------------+---------+-----------+----------+--------------+ PFV      Full                                                        +---------+---------------+---------+-----------+----------+--------------+ POP      Full           Yes      Yes                                 +---------+---------------+---------+-----------+----------+--------------+ PTV      Full                                                        +---------+---------------+---------+-----------+----------+--------------+ PERO     Full                                                        +---------+---------------+---------+-----------+----------+--------------+   +---------+---------------+---------+-----------+----------+--------------+ LEFT     CompressibilityPhasicitySpontaneityPropertiesThrombus Aging  +---------+---------------+---------+-----------+----------+--------------+ CFV      Full           Yes      Yes                                 +---------+---------------+---------+-----------+----------+--------------+ SFJ      Full                                                        +---------+---------------+---------+-----------+----------+--------------+ FV Prox  Full                                                        +---------+---------------+---------+-----------+----------+--------------+  FV Mid   Full                                                        +---------+---------------+---------+-----------+----------+--------------+ FV DistalFull                                                        +---------+---------------+---------+-----------+----------+--------------+ PFV      Full                                                        +---------+---------------+---------+-----------+----------+--------------+ POP      Full           Yes      Yes                                 +---------+---------------+---------+-----------+----------+--------------+ PTV      Full                                                        +---------+---------------+---------+-----------+----------+--------------+ PERO     Full                                                        +---------+---------------+---------+-----------+----------+--------------+     Summary: RIGHT: - There is no evidence of deep vein thrombosis in the lower extremity.  - No cystic structure found in the popliteal fossa.  LEFT: - There is no evidence of deep vein thrombosis in the lower extremity.  - No cystic structure found in the popliteal fossa.  *See table(s) above for measurements and observations. Electronically signed by Norman Serve on 07/02/2024 at 2:05:19 PM.    Final    ECHOCARDIOGRAM COMPLETE Result Date: 07/02/2024    ECHOCARDIOGRAM REPORT   Patient Name:    Becky Gomez Date of Exam: 07/02/2024 Medical Rec #:  [id number]         Height:       66.0 in Accession #:    [id number]        Weight:       235.0 lb Date of Birth:  1991-05-31          BSA:          2.142 m Patient Age:    33 years          BP:           122/73 mmHg Patient Gender: F                 HR:           63 bpm. Exam Location:  Inpatient Procedure: 2D Echo, Cardiac Doppler and Color Doppler (Both Spectral and Color            Flow Doppler were utilized during procedure). Indications:    Pulmonary Embolus  History:        Patient has no prior history of Echocardiogram examinations.  Sonographer:    Philomena Daring Referring Phys: [id number] ALLISON WOLFE IMPRESSIONS  1. Left ventricular ejection fraction, by estimation, is 60 to 65%. The left ventricle has normal function. The left ventricle has no regional wall motion abnormalities. Left ventricular diastolic parameters were normal.  2. Right ventricular systolic function is normal. The right ventricular size is normal. Tricuspid regurgitation signal is inadequate for assessing PA pressure. Normal PAT (>152ms)  3. The mitral valve is normal in structure. No evidence of mitral valve regurgitation. No evidence of mitral stenosis.  4. The aortic valve is tricuspid. Aortic valve regurgitation is not visualized. No aortic stenosis is present.  5. The inferior vena cava is normal in size with greater than 50% respiratory variability, suggesting right atrial pressure of 3 mmHg. FINDINGS  Left Ventricle: Left ventricular ejection fraction, by estimation, is 60 to 65%. The left ventricle has normal function. The left ventricle has no regional wall motion abnormalities. The left ventricular internal cavity size was normal in size. There is  no left ventricular hypertrophy. Left ventricular diastolic parameters were normal. Right Ventricle: The right ventricular size is normal. No increase in right ventricular wall thickness. Right ventricular systolic function is  normal. Tricuspid regurgitation signal is inadequate for assessing PA pressure. Left Atrium: Left atrial size was normal in size. Right Atrium: Right atrial size was normal in size. Pericardium: There is no evidence of pericardial effusion. Mitral Valve: The mitral valve is normal in structure. No evidence of mitral valve regurgitation. No evidence of mitral valve stenosis. Tricuspid Valve: The tricuspid valve is normal in structure. Tricuspid valve regurgitation is trivial. No evidence of tricuspid stenosis. Aortic Valve: The aortic valve is tricuspid. Aortic valve regurgitation is not visualized. No aortic stenosis is present. Pulmonic Valve: The pulmonic valve was normal in structure. Pulmonic valve regurgitation is trivial. No evidence of pulmonic stenosis. Aorta: The aortic root is normal in size and structure. Venous: The inferior vena cava is normal in size with greater than 50% respiratory variability, suggesting right atrial pressure of 3 mmHg. IAS/Shunts: No atrial level shunt detected by color flow Doppler.  LEFT VENTRICLE PLAX 2D LVIDd:         4.50 cm   Diastology LVIDs:         2.70 cm   LV e' medial:    8.81 cm/s LV PW:         0.90 cm   LV E/e' medial:  8.2 LV IVS:        0.80 cm   LV e' lateral:   11.70 cm/s LVOT diam:     2.10 cm   LV E/e' lateral: 6.2 LV SV:         79 LV SV Index:   37 LVOT Area:     3.46 cm  RIGHT VENTRICLE             IVC RV S prime:     10.80 cm/s  IVC diam: 1.80 cm TAPSE (M-mode): 2.1 cm LEFT ATRIUM             Index        RIGHT ATRIUM  Index LA diam:        3.30 cm 1.54 cm/m   RA Area:     13.70 cm LA Vol (A2C):   29.1 ml 13.59 ml/m  RA Volume:   30.90 ml  14.43 ml/m LA Vol (A4C):   24.7 ml 11.53 ml/m LA Biplane Vol: 28.1 ml 13.12 ml/m  AORTIC VALVE LVOT Vmax:   113.00 cm/s LVOT Vmean:  70.400 cm/s LVOT VTI:    0.228 m  AORTA Ao Root diam: 2.90 cm Ao Asc diam:  2.60 cm MITRAL VALVE               TRICUSPID VALVE MV Area (PHT): 3.39 cm    TR Peak grad:   2.8  mmHg MV Decel Time: 224 msec    TR Vmax:        84.20 cm/s MV E velocity: 72.00 cm/s MV A velocity: 58.30 cm/s  SHUNTS MV E/A ratio:  1.23        Systemic VTI:  0.23 m                            Systemic Diam: 2.10 cm Morene Brownie Electronically signed by Morene Brownie Signature Date/Time: 07/02/2024/8:27:36 AM    Final    CT Angio Chest PE W and/or Wo Contrast Result Date: 07/01/2024 CLINICAL DATA:  Shortness of breath with dizziness and congestion. Positive D-dimer. Possible pulmonary embolus. EXAM: CT ANGIOGRAPHY CHEST WITH CONTRAST TECHNIQUE: Multidetector CT imaging of the chest was performed using the standard protocol during bolus administration of intravenous contrast. Multiplanar CT image reconstructions and MIPs were obtained to evaluate the vascular anatomy. RADIATION DOSE REDUCTION: This exam was performed according to the departmental dose-optimization program which includes automated exposure control, adjustment of the mA and/or kV according to patient size and/or use of iterative reconstruction technique. CONTRAST:  75mL OMNIPAQUE  IOHEXOL  350 MG/ML SOLN COMPARISON:  Chest x-ray today. FINDINGS: Cardiovascular: Heart is normal size. Thoracic aorta is normal in caliber. Pulmonary arterial system is well opacified. Evidence of 2 small subsegmental emboli over the right lower lobar pulmonary arteries. Remaining vascular structures are unremarkable. Mediastinum/Nodes: 1.1 cm right hilar lymph node likely reactive. No significant mediastinal or left hilar adenopathy. Remaining mediastinal structures are unremarkable. Lungs/Pleura: Lungs are adequately inflated. There is posterior bibasilar dependent opacification likely atelectasis although could not exclude infection. Calcified granuloma over the left upper lobe. Nodular airspace process over the left lower lobe with the largest focus measuring 2.3 cm. Findings may be due to an infectious or inflammatory process. No effusion. Airways are normal. Upper  Abdomen: Previous cholecystectomy.  No acute findings. Musculoskeletal: No focal abnormality. Review of the MIP images confirms the above findings. IMPRESSION: 1. Evidence of 2 small subsegmental emboli over the right lower lobar pulmonary arteries. 2. Nodular airspace process over the left lower lobe with the largest focus measuring 2.3 cm. Findings may be due to an infectious or inflammatory process. Recommend follow-up CT 4-6 weeks. 3. Posterior bibasilar dependent opacification likely atelectasis, although could not exclude infection. 4. 1.1 cm right hilar lymph node likely reactive. Critical Value/emergent results were called by telephone at the time of interpretation on 07/01/2024 at 2:30 pm to provider [facility] , who verbally acknowledged these results. Electronically Signed   By: Toribio Agreste M.D.   On: 07/01/2024 14:30   DG Chest 2 View Result Date: 07/01/2024 CLINICAL DATA:  SOB.  Dyspnea on exertion. EXAM: CHEST - 2 VIEW COMPARISON:  None Available. FINDINGS: Bilateral lung fields are clear. Bilateral costophrenic angles are clear. Normal cardio-mediastinal silhouette. No acute osseous abnormalities. The soft tissues are within normal limits. IMPRESSION: No active cardiopulmonary disease. Electronically Signed   By: Ree Molt M.D.   On: 07/01/2024 10:42        Scheduled Meds:  enoxaparin  (LOVENOX ) injection  110 mg Subcutaneous Q12H   famotidine   20 mg Oral Daily   Continuous Infusions:   LOS: 0 days    Time spent: 40 minutes    Toribio Hummer, MD [facility]   To contact the attending provider between 7A-7P or the covering provider during after hours 7P-7A, please log into the web site [email or web address] and access using universal [place] password for that web site. If you do not have the password, please call the hospital operator.  07/02/2024, 4:38 PM

## 2024-07-02 NOTE — Plan of Care (Signed)
 Had a discussion with patient about coagulation and blood flow throughout the body to help with the understanding of how blood clots form and how a PE develops.  The patient is now moving independently within the room without assistance, and she is eating well.  Toradol  was prescribed for pain, moving her chest and neck pain down to a pain scale level of 2.

## 2024-07-03 ENCOUNTER — Observation Stay (HOSPITAL_BASED_OUTPATIENT_CLINIC_OR_DEPARTMENT_OTHER): Payer: Self-pay

## 2024-07-03 ENCOUNTER — Other Ambulatory Visit (HOSPITAL_COMMUNITY): Payer: Self-pay

## 2024-07-03 DIAGNOSIS — R0602 Shortness of breath: Secondary | ICD-10-CM

## 2024-07-03 DIAGNOSIS — M5412 Radiculopathy, cervical region: Secondary | ICD-10-CM

## 2024-07-03 DIAGNOSIS — R29898 Other symptoms and signs involving the musculoskeletal system: Secondary | ICD-10-CM

## 2024-07-03 DIAGNOSIS — R200 Anesthesia of skin: Secondary | ICD-10-CM

## 2024-07-03 DIAGNOSIS — R531 Weakness: Secondary | ICD-10-CM

## 2024-07-03 MED ORDER — DICLOFENAC SODIUM 1 % EX GEL
2.0000 g | Freq: Three times a day (TID) | CUTANEOUS | Status: DC
  Administered 2024-07-03 – 2024-07-04 (×4): 2 g via TOPICAL
  Filled 2024-07-03: qty 100

## 2024-07-03 MED ORDER — APIXABAN 5 MG PO TABS: 5.0000 mg | ORAL_TABLET | Freq: Two times a day (BID) | ORAL | Status: DC

## 2024-07-03 MED ORDER — APIXABAN 5 MG PO TABS
10.0000 mg | ORAL_TABLET | Freq: Two times a day (BID) | ORAL | Status: DC
  Administered 2024-07-03 – 2024-07-04 (×2): 10 mg via ORAL
  Filled 2024-07-03 (×2): qty 2

## 2024-07-03 NOTE — Plan of Care (Signed)
 Patient ambulated in hall today and maintained a 98% O2 pulse ox reading.  She is eating well and ambulating independently to the restroom.

## 2024-07-03 NOTE — Progress Notes (Signed)
 PHARMACY - ANTICOAGULATION CONSULT NOTE  Pharmacy Consult for Eliquis  Indication: pulmonary embolus  Allergies  Allergen Reactions   Penicillins Nausea And Vomiting    Has patient had a PCN reaction causing immediate rash, facial/tongue/throat swelling, SOB or lightheadedness with hypotension: no Has patient had a PCN reaction causing severe rash involving mucus membranes or skin necrosis: no Has patient had a PCN reaction that required hospitalization: no Has patient had a PCN reaction occurring within the last 10 years: no If all of the above answers are NO, then may proceed with Cephalosporin use.    Pineapple Hives    Patient Measurements: Height: 5' 6 (167.6 cm) Weight: 106.6 kg (235 lb) IBW/kg (Calculated) : 59.3 HEPARIN DW (KG): 83.9  Vital Signs: Temp: 98.5 F (36.9 C) (08/05 1203) Temp Source: Oral (08/05 1203) BP: 132/77 (08/05 1203) Pulse Rate: 77 (08/05 1203)  Labs: Recent Labs    07/01/24 1059 07/01/24 1454 07/02/24 0348  HGB 13.6  --  13.2  HCT 42.9  --  41.0  PLT 331  --  307  CREATININE 0.77  --  0.76  TROPONINIHS 58* 52*  --     Estimated Creatinine Clearance: 124.6 mL/min (by C-G formula based on SCr of 0.76 mg/dL).   Medical History: Past Medical History:  Diagnosis Date   Asthma    Bronchitis    Depression    Headache    Hypoglycemia      Assessment: 33 year old patient presented with progressive shortness of breath and dyspnea on exertion noted to have 2 small subsegmental emboli over the right lower lobe pulmonary arteries. Patient was started on therapeutic Lovenox  and is now to transition to Eliquis .    Plan:  -Last dose of Lovenox  this morning around 0600; start Eliquis  tonight at 1800 -Eliquis  10 mg BID x 7 days, followed by 5 mg BID   Stefano MARLA Bologna, PharmD, BCPS Clinical Pharmacist 07/03/2024 3:59 PM

## 2024-07-03 NOTE — Plan of Care (Signed)
  Problem: Education: Goal: Knowledge of General Education information will improve Description: Including pain rating scale, medication(s)/side effects and non-pharmacologic comfort measures Outcome: Progressing   Problem: Clinical Measurements: Goal: Will remain free from infection Outcome: Progressing Goal: Diagnostic test results will improve Outcome: Progressing Goal: Respiratory complications will improve Outcome: Progressing Goal: Cardiovascular complication will be avoided Outcome: Progressing   Problem: Activity: Goal: Risk for activity intolerance will decrease Outcome: Progressing   Problem: Nutrition: Goal: Adequate nutrition will be maintained Outcome: Progressing   Problem: Coping: Goal: Level of anxiety will decrease Outcome: Progressing   Problem: Pain Managment: Goal: General experience of comfort will improve and/or be controlled Outcome: Progressing

## 2024-07-03 NOTE — TOC Progression Note (Signed)
 Transition of Care [facility]) - Progression Note    Patient Details  Name: Becky Gomez MRN: [id number] Date of Birth: 17-Jan-1991  [facility]) CM/SW Contact  Tawni CHRISTELLA Eva, LCSW Phone Number: 07/03/2024, 10:41 AM  Clinical Narrative:     Received message from pt's RN, pt will be discharged on Eliquis . The pt will need to obtain a 30-day coupon from the inpatient pharmacy. This medication is not eligible under the [facility] program.  CSW attempted to make a PCP appointment with [facility]. They reported that they do not have any openings at the moment and will call the pt when they can schedule her within two weeks after discharge.     Expected Discharge Plan: Home/Self Care Barriers to Discharge: Continued Medical Work up               Expected Discharge Plan and Services       Living arrangements for the past 2 months: Single Family Home                                       Social Drivers of Health (SDOH) Interventions SDOH Screenings   Food Insecurity: No Food Insecurity (07/01/2024)  Housing: Low Risk  (07/01/2024)  Transportation Needs: No Transportation Needs (07/01/2024)  Utilities: Not At Risk (07/01/2024)  Depression (PHQ2-9): High Risk (07/22/2022)  Social Connections: Unknown (04/02/2022)   Received from [facility]  Tobacco Use: Low Risk  (07/01/2024)    Readmission Risk Interventions     No data to display

## 2024-07-03 NOTE — Progress Notes (Signed)
 PROGRESS NOTE    Becky Gomez  FMW:[id number] DOB: 1990-12-19 DOA: 07/01/2024 PCP: Patient, No Pcp Per   No chief complaint on file.   Brief Narrative:  Patient 33 year old female history of bipolar disorder, depression, anxiety who presented to the ED with a 3-week history of progressive shortness of breath and dyspnea on exertion found to have 2 small subsegmental emboli in the right lower lobe of the pulmonary arteries.  Patient admitted, placed on full dose anticoagulation.   Assessment & Plan:   Principal Problem:   Pulmonary embolus (HCC) Active Problems:   Elevated troponin   Leukocytosis   Hypercalcemia   Metallic taste   Snoring   Weakness of right upper extremity  #1 PE -Patient noted to have presented with progressive shortness of breath and dyspnea on exertion noted to have 2 small subsegmental emboli over the right lower lobe pulmonary arteries. -Unprovoked. -Factor V Leiden, prothrombin gene mutation, lupus anticoagulant panel pending.  -Patient noted prior history of miscarriages. - BNP noted to be within normal limits. - Troponin mildly elevated but flat and with no ischemic changes noted on EKG. - Lower extremity Dopplers negative for DVT. - 2D echo with EF of 60 to 65%, NWMA, normal right ventricular systolic function, normal right ventricular size. - Patient currently on full dose Lovenox  and will transition to Eliquis  today.   - Check ambulatory sats.   2.  Elevated troponin -Troponin noted to be elevated and flat at 58>> 52, EKG with no ischemic changes noted. - Likely secondary to problem #1. - BNP within normal limits. -2D echo with EF of 60 to 65%, NWMA, normal right ventricular systolic function, normal right ventricular size. -Outpatient follow-up with PCP.  3.  Leukocytosis -Likely secondary to problem #1. - Chest x-ray negative for any acute infiltrate. - CT angiogram chest with some nodular airspace opacities over the left lower lobe. -  Procalcitonin negative, CRP 0.6. - Patient afebrile. - Urinalysis nitrite negative, leukocytes negative. - Monitor off antibiotics.  4.  Metallic taste -??  GERD - Pepcid .    5.  Hypercalcemia -Improved. - Follow.  6.  Right upper extremity numbness/weakness -Patient with some complaints of right upper extremity weakness and numbness that she states extends to her right side of her neck. - Check a CT head, CT C-spine. - Pain management.   DVT prophylaxis: Lovenox >>>> Eliquis  Code Status: Full Family Communication: Updated patient and mother at bedside. Disposition: Home when clinically improved and has been transitionED to [name]  Status is: Observation The patient remains OBS appropriate and will d/c before 2 midnights.   Consultants:  None   Procedures:  CT angiogram chest 07/28/2024 Chest x-ray 07/01/2024 2D echo 07/02/2024 Lower extremity Dopplers 07/02/2024 CT head/CT C-spine pending  Antimicrobials:  Anti-infectives (From admission, onward)    None         Subjective: Patient sitting up in bed getting her hair braided by her sister-in-law.  Daughter is at bedside.  Patient denies any chest pain.  States shortness of breath is worse today than it was yesterday.  Patient complaining of right upper extremity pain and numbness radiating up to her neck.  Denies any bleeding.   Objective: Vitals:   07/02/24 1418 07/02/24 2024 07/03/24 0432 07/03/24 1203  BP: 125/77 113/73 110/67 132/77  Pulse: 69 70 66 77  Resp: 16 20 14 18   Temp: 98.2 F (36.8 C) 98.4 F (36.9 C) 98 F (36.7 C) 98.5 F (36.9 C)  TempSrc:  Oral Oral  Oral  SpO2: 99% 99% 98% 99%  Weight:      Height:        Intake/Output Summary (Last 24 hours) at 07/03/2024 1551 Last data filed at 07/02/2024 1700 Gross per 24 hour  Intake 120 ml  Output --  Net 120 ml   Filed Weights   07/01/24 0927  Weight: 106.6 kg    Examination:  General exam: NAD. Respiratory system: CTAB.  No wheezes, no  crackles, no rhonchi.  Fair air movement.  Speaking in full sentences.  Cardiovascular system: Regular rate rhythm no murmurs rubs or gallops.  No JVD.  No pitting lower extremity edema.  Gastrointestinal system: Abdomen is soft, nontender, nondistended, positive bowel sounds.  No rebound.  No guarding.  Central nervous system: Alert and oriented. No focal neurological deficits. Extremities: Right upper extremity with some tenderness to palpation.  Right upper extremity weakness.  Right upper extremity with some tenderness to palpation. Skin: No rashes, lesions or ulcers Psychiatry: Judgement and insight appear normal. Mood & affect appropriate.     Data Reviewed: I have personally reviewed following labs and imaging studies  CBC: Recent Labs  Lab 07/01/24 1059 07/02/24 0348  WBC 13.2* 14.4*  NEUTROABS 5.2  --   HGB 13.6 13.2  HCT 42.9 41.0  MCV 86.8 87.4  PLT 331 307    Basic Metabolic Panel: Recent Labs  Lab 07/01/24 1059 07/01/24 1728 07/02/24 0348  NA 142  --  141  K 4.1  --  3.6  CL 110  --  107  CO2 23  --  25  GLUCOSE 95  --  88  BUN 7  --  8  CREATININE 0.77  --  0.76  CALCIUM 10.5*  --  10.0  MG  --  2.1  --     GFR: Estimated Creatinine Clearance: 124.6 mL/min (by C-G formula based on SCr of 0.76 mg/dL).  Liver Function Tests: Recent Labs  Lab 07/01/24 1059  AST 25  ALT 33  ALKPHOS 90  BILITOT 0.6  PROT 7.1  ALBUMIN 3.8    CBG: No results for input(s): GLUCAP in the last 168 hours.   No results found for this or any previous visit (from the past 240 hours).       Radiology Studies: DG CHEST PORT 1 VIEW Result Date: 07/03/2024 CLINICAL DATA:  Shortness of breath EXAM: PORTABLE CHEST 1 VIEW COMPARISON:  None Available. FINDINGS: The heart size and mediastinal contours are within normal limits. Both lungs are clear. The visualized skeletal structures are unremarkable. IMPRESSION: Normal Electronically Signed   By: Nancyann Burns M.D.   On:  07/03/2024 14:41   CT HEAD WO CONTRAST ( ) Result Date: 07/03/2024 CLINICAL DATA:  Weakness and numbness EXAM: CT HEAD WITHOUT CONTRAST TECHNIQUE: Contiguous axial images were obtained from the base of the skull through the vertex without intravenous contrast. RADIATION DOSE REDUCTION: This exam was performed according to the departmental dose-optimization program which includes automated exposure control, adjustment of the mA and/or kV according to patient size and/or use of iterative reconstruction technique. COMPARISON:  None Available. FINDINGS: CT HEAD: The brain parenchyma appears normal. There is no hemorrhage. No acute ischemic changes. No mass lesion. The ventricles are normal. Skull/sinuses/orbits: No significant abnormality. IMPRESSION: Normal Electronically Signed   By: Nancyann Burns M.D.   On: 07/03/2024 14:41   CT CERVICAL SPINE WO CONTRAST Result Date: 07/03/2024 CLINICAL DATA:  Cervical radiculopathy EXAM: CT CERVICAL SPINE WITHOUT CONTRAST TECHNIQUE: Multidetector CT imaging  of the cervical spine was performed without intravenous contrast. Multiplanar CT image reconstructions were also generated. RADIATION DOSE REDUCTION: This exam was performed according to the departmental dose-optimization program which includes automated exposure control, adjustment of the mA and/or kV according to patient size and/or use of iterative reconstruction technique. COMPARISON:  None Available. FINDINGS: The craniocervical junction is normal. Atlantoaxial articulation is normal. No bone lesion identified. No significant soft tissue abnormality identified. C2-C3: Normal C3-C4: Normal C4-C5: Normal C5-C6: Normal C6-C7: Normal C7-T1: Normal IMPRESSION: Normal. No disc herniation or other cause for a radiculopathy identified. Electronically Signed   By: Nancyann Burns M.D.   On: 07/03/2024 14:39   VAS US  LOWER EXTREMITY VENOUS (DVT) Result Date: 07/02/2024  Lower Venous DVT Study Patient Name:  EMIL KLASSEN   Date of Exam:   07/02/2024 Medical Rec #: [id number]          Accession #:    [id number] Date of Birth: 1990/12/01           Patient Gender: F Patient Age:   9 years Exam Location:  [facility] Procedure:      VAS US  LOWER EXTREMITY VENOUS (DVT) Referring Phys: ISAIAH GERALDS --------------------------------------------------------------------------------  Indications: Pulmonary embolism.  Risk Factors: Confirmed PE. Comparison Study: No prior studies. Performing Technologist: Cordella Collet RVT  Examination Guidelines: A complete evaluation includes B-mode imaging, spectral Doppler, color Doppler, and power Doppler as needed of all accessible portions of each vessel. Bilateral testing is considered an integral part of a complete examination. Limited examinations for reoccurring indications may be performed as noted. The reflux portion of the exam is performed with the patient in reverse Trendelenburg.  +---------+---------------+---------+-----------+----------+--------------+ RIGHT    CompressibilityPhasicitySpontaneityPropertiesThrombus Aging +---------+---------------+---------+-----------+----------+--------------+ CFV      Full           Yes      Yes                                 +---------+---------------+---------+-----------+----------+--------------+ SFJ      Full                                                        +---------+---------------+---------+-----------+----------+--------------+ FV Prox  Full                                                        +---------+---------------+---------+-----------+----------+--------------+ FV Mid   Full                                                        +---------+---------------+---------+-----------+----------+--------------+ FV DistalFull                                                        +---------+---------------+---------+-----------+----------+--------------+ PFV  Full                                                         +---------+---------------+---------+-----------+----------+--------------+ POP      Full           Yes      Yes                                 +---------+---------------+---------+-----------+----------+--------------+ PTV      Full                                                        +---------+---------------+---------+-----------+----------+--------------+ PERO     Full                                                        +---------+---------------+---------+-----------+----------+--------------+   +---------+---------------+---------+-----------+----------+--------------+ LEFT     CompressibilityPhasicitySpontaneityPropertiesThrombus Aging +---------+---------------+---------+-----------+----------+--------------+ CFV      Full           Yes      Yes                                 +---------+---------------+---------+-----------+----------+--------------+ SFJ      Full                                                        +---------+---------------+---------+-----------+----------+--------------+ FV Prox  Full                                                        +---------+---------------+---------+-----------+----------+--------------+ FV Mid   Full                                                        +---------+---------------+---------+-----------+----------+--------------+ FV DistalFull                                                        +---------+---------------+---------+-----------+----------+--------------+ PFV      Full                                                        +---------+---------------+---------+-----------+----------+--------------+  POP      Full           Yes      Yes                                 +---------+---------------+---------+-----------+----------+--------------+ PTV      Full                                                         +---------+---------------+---------+-----------+----------+--------------+ PERO     Full                                                        +---------+---------------+---------+-----------+----------+--------------+     Summary: RIGHT: - There is no evidence of deep vein thrombosis in the lower extremity.  - No cystic structure found in the popliteal fossa.  LEFT: - There is no evidence of deep vein thrombosis in the lower extremity.  - No cystic structure found in the popliteal fossa.  *See table(s) above for measurements and observations. Electronically signed by Norman Serve on 07/02/2024 at 2:05:19 PM.    Final    ECHOCARDIOGRAM COMPLETE Result Date: 07/02/2024    ECHOCARDIOGRAM REPORT   Patient Name:   [name] Date of Exam: 07/02/2024 Medical Rec #:  [id number]         Height:       66.0 in Accession #:    [id number]        Weight:       235.0 lb Date of Birth:  [birth year]          BSA:          2.142 m Patient Age:    33 years          BP:           122/73 mmHg Patient Gender: F                 HR:           63 bpm. Exam Location:  Inpatient Procedure: 2D Echo, Cardiac Doppler and Color Doppler (Both Spectral and Color            Flow Doppler were utilized during procedure). Indications:    Pulmonary Embolus  History:        Patient has no prior history of Echocardiogram examinations.  Sonographer:    Philomena Daring Referring Phys: [id number] ALLISON WOLFE IMPRESSIONS  1. Left ventricular ejection fraction, by estimation, is 60 to 65%. The left ventricle has normal function. The left ventricle has no regional wall motion abnormalities. Left ventricular diastolic parameters were normal.  2. Right ventricular systolic function is normal. The right ventricular size is normal. Tricuspid regurgitation signal is inadequate for assessing PA pressure. Normal PAT (>133ms)  3. The mitral valve is normal in structure. No evidence of mitral valve regurgitation. No evidence of mitral stenosis.  4. The aortic  valve is tricuspid. Aortic valve regurgitation is not visualized. No aortic stenosis is present.  5. The inferior vena cava is normal in size with greater than 50% respiratory variability, suggesting  right atrial pressure of 3 mmHg. FINDINGS  Left Ventricle: Left ventricular ejection fraction, by estimation, is 60 to 65%. The left ventricle has normal function. The left ventricle has no regional wall motion abnormalities. The left ventricular internal cavity size was normal in size. There is  no left ventricular hypertrophy. Left ventricular diastolic parameters were normal. Right Ventricle: The right ventricular size is normal. No increase in right ventricular wall thickness. Right ventricular systolic function is normal. Tricuspid regurgitation signal is inadequate for assessing PA pressure. Left Atrium: Left atrial size was normal in size. Right Atrium: Right atrial size was normal in size. Pericardium: There is no evidence of pericardial effusion. Mitral Valve: The mitral valve is normal in structure. No evidence of mitral valve regurgitation. No evidence of mitral valve stenosis. Tricuspid Valve: The tricuspid valve is normal in structure. Tricuspid valve regurgitation is trivial. No evidence of tricuspid stenosis. Aortic Valve: The aortic valve is tricuspid. Aortic valve regurgitation is not visualized. No aortic stenosis is present. Pulmonic Valve: The pulmonic valve was normal in structure. Pulmonic valve regurgitation is trivial. No evidence of pulmonic stenosis. Aorta: The aortic root is normal in size and structure. Venous: The inferior vena cava is normal in size with greater than 50% respiratory variability, suggesting right atrial pressure of 3 mmHg. IAS/Shunts: No atrial level shunt detected by color flow Doppler.  LEFT VENTRICLE PLAX 2D LVIDd:         4.50 cm   Diastology LVIDs:         2.70 cm   LV e' medial:    8.81 cm/s LV PW:         0.90 cm   LV E/e' medial:  8.2 LV IVS:        0.80 cm   LV e'  lateral:   11.70 cm/s LVOT diam:     2.10 cm   LV E/e' lateral: 6.2 LV SV:         79 LV SV Index:   37 LVOT Area:     3.46 cm  RIGHT VENTRICLE             IVC RV S prime:     10.80 cm/s  IVC diam: 1.80 cm TAPSE (M-mode): 2.1 cm LEFT ATRIUM             Index        RIGHT ATRIUM           Index LA diam:        3.30 cm 1.54 cm/m   RA Area:     13.70 cm LA Vol (A2C):   29.1 ml 13.59 ml/m  RA Volume:   30.90 ml  14.43 ml/m LA Vol (A4C):   24.7 ml 11.53 ml/m LA Biplane Vol: 28.1 ml 13.12 ml/m  AORTIC VALVE LVOT Vmax:   113.00 cm/s LVOT Vmean:  70.400 cm/s LVOT VTI:    0.228 m  AORTA Ao Root diam: 2.90 cm Ao Asc diam:  2.60 cm MITRAL VALVE               TRICUSPID VALVE MV Area (PHT): 3.39 cm    TR Peak grad:   2.8 mmHg MV Decel Time: 224 msec    TR Vmax:        84.20 cm/s MV E velocity: 72.00 cm/s MV A velocity: 58.30 cm/s  SHUNTS MV E/A ratio:  1.23        Systemic VTI:  0.23 m  Systemic Diam: 2.10 cm Morene Brownie Electronically signed by Morene Brownie Signature Date/Time: 07/02/2024/8:27:36 AM    Final         Scheduled Meds:  enoxaparin  (LOVENOX ) injection  110 mg Subcutaneous Q12H   famotidine   20 mg Oral Daily   Continuous Infusions:   LOS: 0 days    Time spent: 40 minutes    Toribio Hummer, MD [facility]   To contact the attending provider between 7A-7P or the covering provider during after hours 7P-7A, please log into the web site [email or web address] and access using universal [place] password for that web site. If you do not have the password, please call the hospital operator.  07/03/2024, 3:51 PM

## 2024-07-03 NOTE — Discharge Instructions (Addendum)
 Information on my medicine - ELIQUIS  (apixaban )   Why was Eliquis  prescribed for you? Eliquis  was prescribed to treat blood clots that may have been found in the veins of your legs (deep vein thrombosis) or in your lungs (pulmonary embolism) and to reduce the risk of them occurring again.  What do You need to know about Eliquis  ? The starting dose is 10 mg (two 5 mg tablets) taken TWICE daily for the FIRST SEVEN (7) DAYS, then on 07/10/24  the dose is reduced to ONE 5 mg tablet taken TWICE daily.  Eliquis  may be taken with or without food.   Try to take the dose about the same time in the morning and in the evening. If you have difficulty swallowing the tablet whole please discuss with your pharmacist how to take the medication safely.  Take Eliquis  exactly as prescribed and DO NOT stop taking Eliquis  without talking to the doctor who prescribed the medication.  Stopping may increase your risk of developing a new blood clot.  Refill your prescription before you run out.  After discharge, you should have regular check-up appointments with your healthcare provider that is prescribing your Eliquis .    What do you do if you miss a dose? If a dose of ELIQUIS  is not taken at the scheduled time, take it as soon as possible on the same day and twice-daily administration should be resumed. The dose should not be doubled to make up for a missed dose.  Important Safety Information A possible side effect of Eliquis  is bleeding. You should call your healthcare provider right away if you experience any of the following: Bleeding from an injury or your nose that does not stop. Unusual colored urine (red or dark brown) or unusual colored stools (red or black). Unusual bruising for unknown reasons. A serious fall or if you hit your head (even if there is no bleeding).  Some medicines may interact with Eliquis  and might increase your risk of bleeding or clotting while on Eliquis . To help avoid  this, consult your healthcare provider or pharmacist prior to using any new prescription or non-prescription medications, including herbals, vitamins, non-steroidal anti-inflammatory drugs (NSAIDs) and supplements.  This website has more information on Eliquis  (apixaban ): [email or web address]

## 2024-07-04 ENCOUNTER — Observation Stay (HOSPITAL_COMMUNITY): Payer: Self-pay

## 2024-07-04 ENCOUNTER — Other Ambulatory Visit (HOSPITAL_COMMUNITY): Payer: Self-pay

## 2024-07-04 LAB — BASIC METABOLIC PANEL WITH GFR
Anion gap: 11 (ref 5–15)
BUN: 10 mg/dL (ref 6–20)
CO2: 24 mmol/L (ref 22–32)
Calcium: 10.3 mg/dL (ref 8.9–10.3)
Chloride: 107 mmol/L (ref 98–111)
Creatinine, Ser: 0.78 mg/dL (ref 0.44–1.00)
GFR, Estimated: >60 mL/min (ref >60)
Glucose, Bld: 94 mg/dL (ref 70–99)
Potassium: 3.7 mmol/L (ref 3.5–5.1)
Sodium: 142 mmol/L (ref 135–145)

## 2024-07-04 LAB — CBC
HCT: 40.8 % (ref 36.0–46.0)
Hemoglobin: 13 g/dL (ref 12.0–15.0)
MCH: 27.8 pg (ref 26.0–34.0)
MCHC: 31.9 g/dL (ref 30.0–36.0)
MCV: 87.2 fL (ref 80.0–100.0)
Platelets: 269 K/uL (ref 150–400)
RBC: 4.68 MIL/uL (ref 3.87–5.11)
RDW: 14.2 % (ref 11.5–15.5)
WBC: 13.3 K/uL — ABNORMAL HIGH (ref 4.0–10.5)
nRBC: 0 % (ref 0.0–0.2)

## 2024-07-04 MED ORDER — APIXABAN 5 MG PO TABS
ORAL_TABLET | ORAL | 0 refills | Status: DC
  Filled 2024-07-04: qty 74, 30d supply, fill #0

## 2024-07-04 MED ORDER — GABAPENTIN 100 MG PO CAPS
100.0000 mg | ORAL_CAPSULE | Freq: Three times a day (TID) | ORAL | 2 refills | Status: AC
Start: 2024-07-04 — End: 2024-09-24
  Filled 2024-07-04: qty 90, 30d supply, fill #0

## 2024-07-04 MED ORDER — DICLOFENAC SODIUM 1 % EX GEL
2.0000 g | Freq: Three times a day (TID) | CUTANEOUS | 0 refills | Status: AC
  Filled 2024-07-04: qty 100, 17d supply, fill #0

## 2024-07-04 NOTE — Discharge Summary (Signed)
 Physician Discharge Summary  Becky Gomez FMW:[id number] DOB: 05/03/1991 DOA: 07/01/2024  PCP: Patient, No Pcp Per  Admit date: 07/01/2024 Discharge date: 07/04/2024  Admitted From: Home  Discharge disposition: home    Recommendations for Outpatient Follow-Up:   Follow up with your primary care provider as has been scheduled Check CBC, BMP, magnesium in the next visit   Discharge Diagnosis:   Principal Problem:   Pulmonary embolus (HCC) Active Problems:   Elevated troponin   Leukocytosis   Hypercalcemia   Metallic taste   Snoring   Weakness of right upper extremity    Discharge Condition: Improved.  Diet recommendation: .  Regular.  Wound care: None.  Code status: Full.   History of Present Illness:   Patient  is a 33 year old female history of bipolar disorder, depression, anxiety presented to the hospital with progressive shortness of breath and dyspnea on exertion for 3 weeks.  In the ED, CT scan of the chest done showed 2 small subsegmental emboli in the right lower lobe pulmonary arteries.  Patient was then admitted hospital for further evaluation and treatment.    Hospital Course:   Following conditions were addressed during hospitalization as listed below,  Acute pulmonary embolism. Right lower lobe pulmonary embolism unprovoked. Factor V Leiden, prothrombin gene mutation pending. Patient noted prior history of miscarriages.  Troponin mildly elevated but flat.  EKG unremarkable.  Lower extremity duplex negative for DVT.  BNP within normal limits.  Review of 2D echocardiogram showed EF of 60 to 65%, NWMA, normal right ventricular systolic function, normal right ventricular size.  Was initially on full dose Lovenox  and was transitioned to Eliquis .  Patient will be continued on Eliquis  on discharge.  Follow-up with PCP recommended.    Elevated troponin Flat troponins.  Likely secondary to pulmonary embolism.  2D echo without any wall motion abnormality.  No  chest pain or dyspnea at this time.   Leukocytosis Mild likely reactive.  Chest x-ray without infiltrate.  CTA with some nodular space opacity.  Procalcitonin negative CRP negative.  Patient is afebrile.  Urinalysis negative.  No indication for antibiotic.    Hypercalcemia Improved and normalized   Right upper extremity numbness/weakness CT head scan was negative for acute findings CT cervical spine normal without any disc herniation.  MRI of the brain was negative as well.  MRI of the C-spine showed some cystic lesion over the dens and Clivers.  Communicated with neurosurgery on-call Dr. Onetha and review of MRI scan with impression of possible benign lesion and no need for any surgical intervention.   Disposition.  At this time, patient is stable for disposition home with outpatient PCP follow-up  Medical Consultants:   Neurosurgery- verbal consult  Procedures:    None Subjective:   Today, patient was seen and examined at bedside.  Complains of mild numbness and pain over the neck shoulder and right upper extremity.  Denies any shortness of breath dyspnea or chest pain.  Discharge Exam:   Vitals:   07/04/24 1300 07/04/24 1326  BP: 111/75 111/75  Pulse: 64 72  Resp:  18  Temp: 98.3 F (36.8 C) 98.3 F (36.8 C)  SpO2: 99% 98%   Vitals:   07/03/24 2147 07/04/24 0538 07/04/24 1300 07/04/24 1326  BP: 103/66 110/74 111/75 111/75  Pulse: (!) 57 63 64 72  Resp: 15 18  18   Temp: 98.2 F (36.8 C) 97.7 F (36.5 C) 98.3 F (36.8 C) 98.3 F (36.8 C)  TempSrc: Oral Oral Oral  Oral  SpO2: 98% 99% 99% 98%  Weight:      Height:       Body mass index is 37.93 kg/m.  General: Alert awake, not in obvious distress, obese built HENT: pupils equally reacting to light,  No scleral pallor or icterus noted. Oral mucosa is moist.  Chest:  Clear breath sounds.  Diminished breath sounds bilaterally. No crackles or wheezes.  CVS: S1 &S2 heard. No murmur.  Regular rate and  rhythm. Abdomen: Soft, nontender, nondistended.  Bowel sounds are heard.   Extremities: No cyanosis, clubbing or edema.  Peripheral pulses are palpable.  Tenderness over the right side of the neck shoulders Psych: Alert, awake and oriented, normal mood CNS:  No cranial nerve deficits.  Skin: Warm and dry.  No rashes noted.  The results of significant diagnostics from this hospitalization (including imaging, microbiology, ancillary and laboratory) are listed below for reference.     Diagnostic Studies:   VAS US  LOWER EXTREMITY VENOUS (DVT) Result Date: 07/02/2024  Lower Venous DVT Study [name]  Date of Exam:   07/02/2024 Medical Rec #: [id number]          Accession #:    [id number] Date of Birth: [birth year]           Patient Gender: F Patient Age:   32 years Exam Location:  [facility] Procedure:      VAS US  LOWER EXTREMITY VENOUS (DVT) Referring Phys: Becky Gomez --------------------------------------------------------------------------------  Indications: Pulmonary embolism.  Risk Factors: Confirmed PE. Comparison Study: No prior studies. Performing Technologist: Cordella Collet RVT  Examination Guidelines: A complete evaluation includes B-mode imaging, spectral Doppler, color Doppler, and power Doppler as needed of all accessible portions of each vessel. Bilateral testing is considered an integral part of a complete examination. Limited examinations for reoccurring indications may be performed as noted. The reflux portion of the exam is performed with the patient in reverse Trendelenburg.  +---------+---------------+---------+-----------+----------+--------------+ RIGHT    CompressibilityPhasicitySpontaneityPropertiesThrombus Aging +---------+---------------+---------+-----------+----------+--------------+ CFV      Full           Yes      Yes                                 +---------+---------------+---------+-----------+----------+--------------+ SFJ       Full                                                        +---------+---------------+---------+-----------+----------+--------------+ FV Prox  Full                                                        +---------+---------------+---------+-----------+----------+--------------+ FV Mid   Full                                                        +---------+---------------+---------+-----------+----------+--------------+ FV DistalFull                                                        +---------+---------------+---------+-----------+----------+--------------+  PFV      Full                                                        +---------+---------------+---------+-----------+----------+--------------+ POP      Full           Yes      Yes                                 +---------+---------------+---------+-----------+----------+--------------+ PTV      Full                                                        +---------+---------------+---------+-----------+----------+--------------+ PERO     Full                                                        +---------+---------------+---------+-----------+----------+--------------+   +---------+---------------+---------+-----------+----------+--------------+ LEFT     CompressibilityPhasicitySpontaneityPropertiesThrombus Aging +---------+---------------+---------+-----------+----------+--------------+ CFV      Full           Yes      Yes                                 +---------+---------------+---------+-----------+----------+--------------+ SFJ      Full                                                        +---------+---------------+---------+-----------+----------+--------------+ FV Prox  Full                                                        +---------+---------------+---------+-----------+----------+--------------+ FV Mid   Full                                                         +---------+---------------+---------+-----------+----------+--------------+ FV DistalFull                                                        +---------+---------------+---------+-----------+----------+--------------+ PFV      Full                                                        +---------+---------------+---------+-----------+----------+--------------+  POP      Full           Yes      Yes                                 +---------+---------------+---------+-----------+----------+--------------+ PTV      Full                                                        +---------+---------------+---------+-----------+----------+--------------+ PERO     Full                                                        +---------+---------------+---------+-----------+----------+--------------+     Summary: RIGHT: - There is no evidence of deep vein thrombosis in the lower extremity.  - No cystic structure found in the popliteal fossa.  LEFT: - There is no evidence of deep vein thrombosis in the lower extremity.  - No cystic structure found in the popliteal fossa.  *See table(s) above for measurements and observations. Electronically signed by Norman Serve on 07/02/2024 at 2:05:19 PM.    Final    ECHOCARDIOGRAM COMPLETE Result Date: 07/02/2024    ECHOCARDIOGRAM REPORT   Patient Name:   Becky Gomez Date of Exam: 07/02/2024 Medical Rec #:  [id number]         Height:       66.0 in Accession #:    [id number]        Weight:       235.0 lb Date of Birth:  19-Feb-1991          BSA:          2.142 m Patient Age:    33 years          BP:           122/73 mmHg Patient Gender: F                 HR:           63 bpm. Exam Location:  Inpatient Procedure: 2D Echo, Cardiac Doppler and Color Doppler (Both Spectral and Color            Flow Doppler were utilized during procedure). Indications:    Pulmonary Embolus  History:        Patient has no prior history of Echocardiogram  examinations.  Sonographer:    Philomena Daring Referring Phys: [id number] ALLISON WOLFE IMPRESSIONS  1. Left ventricular ejection fraction, by estimation, is 60 to 65%. The left ventricle has normal function. The left ventricle has no regional wall motion abnormalities. Left ventricular diastolic parameters were normal.  2. Right ventricular systolic function is normal. The right ventricular size is normal. Tricuspid regurgitation signal is inadequate for assessing PA pressure. Normal PAT (>144ms)  3. The mitral valve is normal in structure. No evidence of mitral valve regurgitation. No evidence of mitral stenosis.  4. The aortic valve is tricuspid. Aortic valve regurgitation is not visualized. No aortic stenosis is present.  5. The inferior vena cava is normal in size with greater than 50% respiratory variability, suggesting right  atrial pressure of 3 mmHg. FINDINGS  Left Ventricle: Left ventricular ejection fraction, by estimation, is 60 to 65%. The left ventricle has normal function. The left ventricle has no regional wall motion abnormalities. The left ventricular internal cavity size was normal in size. There is  no left ventricular hypertrophy. Left ventricular diastolic parameters were normal. Right Ventricle: The right ventricular size is normal. No increase in right ventricular wall thickness. Right ventricular systolic function is normal. Tricuspid regurgitation signal is inadequate for assessing PA pressure. Left Atrium: Left atrial size was normal in size. Right Atrium: Right atrial size was normal in size. Pericardium: There is no evidence of pericardial effusion. Mitral Valve: The mitral valve is normal in structure. No evidence of mitral valve regurgitation. No evidence of mitral valve stenosis. Tricuspid Valve: The tricuspid valve is normal in structure. Tricuspid valve regurgitation is trivial. No evidence of tricuspid stenosis. Aortic Valve: The aortic valve is tricuspid. Aortic valve regurgitation is  not visualized. No aortic stenosis is present. Pulmonic Valve: The pulmonic valve was normal in structure. Pulmonic valve regurgitation is trivial. No evidence of pulmonic stenosis. Aorta: The aortic root is normal in size and structure. Venous: The inferior vena cava is normal in size with greater than 50% respiratory variability, suggesting right atrial pressure of 3 mmHg. IAS/Shunts: No atrial level shunt detected by color flow Doppler.  LEFT VENTRICLE PLAX 2D LVIDd:         4.50 cm   Diastology LVIDs:         2.70 cm   LV e' medial:    8.81 cm/s LV PW:         0.90 cm   LV E/e' medial:  8.2 LV IVS:        0.80 cm   LV e' lateral:   11.70 cm/s LVOT diam:     2.10 cm   LV E/e' lateral: 6.2 LV SV:         79 LV SV Index:   37 LVOT Area:     3.46 cm  RIGHT VENTRICLE             IVC RV S prime:     10.80 cm/s  IVC diam: 1.80 cm TAPSE (M-mode): 2.1 cm LEFT ATRIUM             Index        RIGHT ATRIUM           Index LA diam:        3.30 cm 1.54 cm/m   RA Area:     13.70 cm LA Vol (A2C):   29.1 ml 13.59 ml/m  RA Volume:   30.90 ml  14.43 ml/m LA Vol (A4C):   24.7 ml 11.53 ml/m LA Biplane Vol: 28.1 ml 13.12 ml/m  AORTIC VALVE LVOT Vmax:   113.00 cm/s LVOT Vmean:  70.400 cm/s LVOT VTI:    0.228 m  AORTA Ao Root diam: 2.90 cm Ao Asc diam:  2.60 cm MITRAL VALVE               TRICUSPID VALVE MV Area (PHT): 3.39 cm    TR Peak grad:   2.8 mmHg MV Decel Time: 224 msec    TR Vmax:        84.20 cm/s MV E velocity: 72.00 cm/s MV A velocity: 58.30 cm/s  SHUNTS MV E/A ratio:  1.23        Systemic VTI:  0.23 m  Systemic Diam: 2.10 cm Becky Gomez Electronically signed by Becky Gomez Signature Date/Time: 07/02/2024/8:27:36 AM    Final    CT Angio Chest PE W and/or Wo Contrast Result Date: 07/01/2024 CLINICAL DATA:  Shortness of breath with dizziness and congestion. Positive D-dimer. Possible pulmonary embolus. EXAM: CT ANGIOGRAPHY CHEST WITH CONTRAST TECHNIQUE: Multidetector CT imaging of the  chest was performed using the standard protocol during bolus administration of intravenous contrast. Multiplanar CT image reconstructions and MIPs were obtained to evaluate the vascular anatomy. RADIATION DOSE REDUCTION: This exam was performed according to the departmental dose-optimization program which includes automated exposure control, adjustment of the mA and/or kV according to patient size and/or use of iterative reconstruction technique. CONTRAST:  75mL OMNIPAQUE  IOHEXOL  350 MG/ML SOLN COMPARISON:  Chest x-ray today. FINDINGS: Cardiovascular: Heart is normal size. Thoracic aorta is normal in caliber. Pulmonary arterial system is well opacified. Evidence of 2 small subsegmental emboli over the right lower lobar pulmonary arteries. Remaining vascular structures are unremarkable. Mediastinum/Nodes: 1.1 cm right hilar lymph node likely reactive. No significant mediastinal or left hilar adenopathy. Remaining mediastinal structures are unremarkable. Lungs/Pleura: Lungs are adequately inflated. There is posterior bibasilar dependent opacification likely atelectasis although could not exclude infection. Calcified granuloma over the left upper lobe. Nodular airspace process over the left lower lobe with the largest focus measuring 2.3 cm. Findings may be due to an infectious or inflammatory process. No effusion. Airways are normal. Upper Abdomen: Previous cholecystectomy.  No acute findings. Musculoskeletal: No focal abnormality. Review of the MIP images confirms the above findings. IMPRESSION: 1. Evidence of 2 small subsegmental emboli over the right lower lobar pulmonary arteries. 2. Nodular airspace process over the left lower lobe with the largest focus measuring 2.3 cm. Findings may be due to an infectious or inflammatory process. Recommend follow-up CT 4-6 weeks. 3. Posterior bibasilar dependent opacification likely atelectasis, although could not exclude infection. 4. 1.1 cm right hilar lymph node likely  reactive. Critical Value/emergent results were called by telephone at the time of interpretation on 07/01/2024 at 2:30 pm to provider [facility] , who verbally acknowledged these results. Electronically Signed   By: Toribio Agreste M.D.   On: 07/01/2024 14:30   DG Chest 2 View Result Date: 07/01/2024 CLINICAL DATA:  SOB.  Dyspnea on exertion. EXAM: CHEST - 2 VIEW COMPARISON:  None Available. FINDINGS: Bilateral lung fields are clear. Bilateral costophrenic angles are clear. Normal cardio-mediastinal silhouette. No acute osseous abnormalities. The soft tissues are within normal limits. IMPRESSION: No active cardiopulmonary disease. Electronically Signed   By: Ree Molt M.D.   On: 07/01/2024 10:42     Labs:   Basic Metabolic Panel: Recent Labs  Lab 07/01/24 1059 07/01/24 1728 07/02/24 0348 07/04/24 0414  NA 142  --  141 142  K 4.1  --  3.6 3.7  CL 110  --  107 107  CO2 23  --  25 24  GLUCOSE 95  --  88 94  BUN 7  --  8 10  CREATININE 0.77  --  0.76 0.78  CALCIUM 10.5*  --  10.0 10.3  MG  --  2.1  --   --    GFR Estimated Creatinine Clearance: 124.6 mL/min (by C-G formula based on SCr of 0.78 mg/dL). Liver Function Tests: Recent Labs  Lab 07/01/24 1059  AST 25  ALT 33  ALKPHOS 90  BILITOT 0.6  PROT 7.1  ALBUMIN 3.8   No results for input(s): LIPASE, AMYLASE in the last 168  hours. No results for input(s): AMMONIA in the last 168 hours. Coagulation profile No results for input(s): INR, PROTIME in the last 168 hours.  CBC: Recent Labs  Lab 07/01/24 1059 07/02/24 0348 07/04/24 0414  WBC 13.2* 14.4* 13.3*  NEUTROABS 5.2  --   --   HGB 13.6 13.2 13.0  HCT 42.9 41.0 40.8  MCV 86.8 87.4 87.2  PLT 331 307 269   Cardiac Enzymes: No results for input(s): CKTOTAL, CKMB, CKMBINDEX, TROPONINI in the last 168 hours. BNP: Invalid input(s): POCBNP CBG: No results for input(s): GLUCAP in the last 168 hours. D-Dimer No results for input(s): DDIMER in  the last 72 hours. Hgb A1c No results for input(s): HGBA1C in the last 72 hours. Lipid Profile No results for input(s): CHOL, HDL, LDLCALC, TRIG, CHOLHDL, LDLDIRECT in the last 72 hours. Thyroid  function studies No results for input(s): TSH, T4TOTAL, T3FREE, THYROIDAB in the last 72 hours.  Invalid input(s): FREET3 Anemia work up No results for input(s): VITAMINB12, FOLATE, FERRITIN, TIBC, IRON, RETICCTPCT in the last 72 hours. Microbiology No results found for this or any previous visit (from the past 240 hours).   Discharge Instructions:   Discharge Instructions     Call MD for:  difficulty breathing, headache or visual disturbances   Complete by: As directed    Call MD for:  severe uncontrolled pain   Complete by: As directed    Diet general   Complete by: As directed    Discharge instructions   Complete by: As directed    Follow-up with your primary care provider in 1 week.  Check blood work at that time.  Take blood thinners as prescribed and Take precautions while on blood thinners.   Seek medical attention for worsening symptoms.   Increase activity slowly   Complete by: As directed       Allergies as of 07/04/2024       Reactions   Penicillins Nausea And Vomiting   Has patient had a PCN reaction causing immediate rash, facial/tongue/throat swelling, SOB or lightheadedness with hypotension: no Has patient had a PCN reaction causing severe rash involving mucus membranes or skin necrosis: no Has patient had a PCN reaction that required hospitalization: no Has patient had a PCN reaction occurring within the last 10 years: no If all of the above answers are NO, then may proceed with Cephalosporin use.   Pineapple Hives        Medication List     TAKE these medications    diclofenac  Sodium 1 % Gel Commonly known as: VOLTAREN  Apply 2 g topically 3 (three) times daily for 17 days.   Eliquis  5 MG Tabs tablet Generic drug:  apixaban  Take 2 tablets (10 mg total) by mouth 2 (two) times daily for 6 days, THEN 1 tablet (5 mg total) 2 (two) times daily. Start taking on: July 04, 2024   gabapentin  100 MG capsule Commonly known as: Neurontin  Take 1 capsule (100 mg total) by mouth 3 (three) times daily for 10 days.        Follow-up Information     Oley Bascom RAMAN, NP Follow up.   Specialties: Pulmonary Disease, Endocrinology Why: Your appointment is on August 14th at 3:00pm at [facility]. Location [address] [place] [address] Contact information: [address] [place] [address] [phone]                  Time coordinating discharge: 59  minutes  Signed:  Dayle Mcnerney  [facility] 07/04/2024, 5:58 PM

## 2024-07-04 NOTE — TOC Transition Note (Signed)
 Transition of Care [facility]) - Discharge Note   Patient Details  Name: Becky Gomez MRN: [id number] Date of Birth: [birth year]  [facility] CM/SW Contact:  Tawni CHRISTELLA Eva, LCSW Phone Number: 07/04/2024, 10:03 AM   Clinical Narrative:     CSW was able to secure pt a PCP appointment with the [facility] on August 14th at 3:00pm at [facility], appointment was attached to pt's AVS. Care management to follow.     Barriers to Discharge: Continued Medical Work up   Patient Goals and CMS Choice Patient states their goals for this hospitalization and ongoing recovery are:: return home          Discharge Placement                       Discharge Plan and Services Additional resources added to the After Visit Summary for                                       Social Drivers of Health (SDOH) Interventions SDOH Screenings   Food Insecurity: No Food Insecurity (07/01/2024)  Housing: Low Risk  (07/01/2024)  Transportation Needs: No Transportation Needs (07/01/2024)  Utilities: Not At Risk (07/01/2024)  Depression (PHQ2-9): High Risk (07/22/2022)  Social Connections: Unknown (04/02/2022)   Received from [facility]  Tobacco Use: Low Risk  (07/01/2024)     Readmission Risk Interventions     No data to display

## 2024-07-04 NOTE — Progress Notes (Signed)
 Discharge medication delivered to patient at bedside [facility]

## 2024-07-05 LAB — FACTOR 5 LEIDEN

## 2024-07-05 LAB — PROTHROMBIN GENE MUTATION

## 2024-07-11 ENCOUNTER — Encounter: Payer: Self-pay | Admitting: Neurology

## 2024-07-12 ENCOUNTER — Encounter: Payer: Self-pay | Admitting: Nurse Practitioner

## 2024-07-12 ENCOUNTER — Ambulatory Visit (INDEPENDENT_AMBULATORY_CARE_PROVIDER_SITE_OTHER): Payer: Self-pay | Admitting: Nurse Practitioner

## 2024-07-12 VITALS — BP 110/85 | HR 88 | Temp 98.0°F | Ht 65.0 in | Wt 244.0 lb

## 2024-07-12 DIAGNOSIS — R200 Anesthesia of skin: Secondary | ICD-10-CM

## 2024-07-12 DIAGNOSIS — I2694 Multiple subsegmental pulmonary emboli without acute cor pulmonale: Secondary | ICD-10-CM

## 2024-07-12 NOTE — Progress Notes (Signed)
 Subjective   Patient ID: Becky Gomez, female    DOB: 1991-01-11, 33 y.o.   MRN: [id number]  Chief Complaint  Patient presents with   Hospitalization Follow-up   Establish Care    Referring provider: No ref. provider found  Becky Gomez is a 33 y.o. female with Past Medical History: No date: Asthma No date: Bronchitis No date: Depression No date: Headache No date: Hypoglycemia  Recent Significant Events:  Hospital admission: 07/01/24  Hospital summary:  Acute pulmonary embolism. Right lower lobe pulmonary embolism unprovoked. Factor V Leiden, prothrombin gene mutation pending. Patient noted prior history of miscarriages.  Troponin mildly elevated but flat.  EKG unremarkable.  Lower extremity duplex negative for DVT.  BNP within normal limits.  Review of 2D echocardiogram showed EF of 60 to 65%, NWMA, normal right ventricular systolic function, normal right ventricular size.  Was initially on full dose Lovenox  and was transitioned to Eliquis .  Patient will be continued on Eliquis  on discharge.  Follow-up with PCP recommended.     Elevated troponin Flat troponins.  Likely secondary to pulmonary embolism.  2D echo without any wall motion abnormality.  No chest pain or dyspnea at this time.    Leukocytosis Mild likely reactive.  Chest x-ray without infiltrate.  CTA with some nodular space opacity.  Procalcitonin negative CRP negative.  Patient is afebrile.  Urinalysis negative.  No indication for antibiotic.      Hypercalcemia Improved and normalized   Right upper extremity numbness/weakness CT head scan was negative for acute findings CT cervical spine normal without any disc herniation.  MRI of the brain was negative as well.  MRI of the C-spine showed some cystic lesion over the dens and Clivers.  Communicated with neurosurgery on-call Dr. Onetha and review of MRI scan with impression of possible benign lesion and no need for any surgical  intervention.   HPI  Patient presents today for hospital follow-up.  She was admitted to the hospital on 07/01/2024 for pulmonary embolism.  She states that overall she has been doing well as far as her breathing is concerned.  She is currently on Eliquis .  She is concerned today about numbness and pain to the right side of her body.  She is unable to use her right arm or right leg.  Burning pain throughout her body.  She states that this started after receiving Lovenox  injections in the hospital.  We will place a referral for a follow-up and further evaluation and treatment.  She states the numbness and pain is progressively worsening.  Perform activities of daily living by herself.  She is unable to move her toes and cannot completely close her fingers. Denies f/c/s, n/v/d, hemoptysis, PND, leg swelling Denies chest pain or edema     Allergies  Allergen Reactions   Penicillins Nausea And Vomiting    Has patient had a PCN reaction causing immediate rash, facial/tongue/throat swelling, SOB or lightheadedness with hypotension: no Has patient had a PCN reaction causing severe rash involving mucus membranes or skin necrosis: no Has patient had a PCN reaction that required hospitalization: no Has patient had a PCN reaction occurring within the last 10 years: no If all of the above answers are NO, then may proceed with Cephalosporin use.    Pineapple Hives     There is no immunization history on file for this patient.  Tobacco History: Social History   Tobacco Use  Smoking Status Never  Smokeless Tobacco Never   Counseling given: Not  Answered   Outpatient Encounter Medications as of 07/12/2024  Medication Sig   apixaban  (ELIQUIS ) 5 MG TABS tablet Take 2 tablets (10 mg total) by mouth 2 (two) times daily for 6 days, THEN 1 tablet (5 mg total) 2 (two) times daily.   diclofenac  Sodium (VOLTAREN ) 1 % GEL Apply 2 g topically 3 (three) times daily for 17 days.   gabapentin  (NEURONTIN ) 100  MG capsule Take 1 capsule (100 mg total) by mouth 3 (three) times daily for 10 days.   No facility-administered encounter medications on file as of 07/12/2024.    Review of Systems  Review of Systems  Constitutional: Negative.   HENT: Negative.    Cardiovascular: Negative.   Gastrointestinal: Negative.   Allergic/Immunologic: Negative.   Neurological: Negative.   Psychiatric/Behavioral: Negative.       Objective:   BP 110/85   Pulse 88   Temp 98 F (36.7 C) (Oral)   Ht 5' 5 (1.651 m)   Wt 244 lb (110.7 kg)   LMP 06/30/2024   SpO2 97%   BMI 40.60 kg/m   Wt Readings from Last 5 Encounters:  07/12/24 244 lb (110.7 kg)  07/01/24 235 lb (106.6 kg)  12/01/18 212 lb (96.2 kg)  01/21/17 217 lb (98.4 kg)     Physical Exam Vitals and nursing note reviewed.  Constitutional:      General: She is not in acute distress.    Appearance: She is well-developed.  Cardiovascular:     Rate and Rhythm: Normal rate and regular rhythm.  Pulmonary:     Effort: Pulmonary effort is normal.     Breath sounds: Normal breath sounds.  Neurological:     Mental Status: She is alert and oriented to person, place, and time.       Assessment & Plan:   Numbness on right side -     Ambulatory referral to Neurology  Multiple subsegmental pulmonary emboli without acute cor pulmonale (HCC) -     Pulmonary Visit     Return in about 3 months (around 10/12/2024).     Bascom GORMAN Borer, NP 07/12/2024

## 2024-07-16 ENCOUNTER — Telehealth: Payer: Self-pay

## 2024-07-16 NOTE — Telephone Encounter (Signed)
 Called and got patient scheduled.

## 2024-07-17 ENCOUNTER — Ambulatory Visit: Payer: Self-pay | Admitting: Neurology

## 2024-07-17 VITALS — BP 118/82 | HR 88

## 2024-07-17 DIAGNOSIS — R202 Paresthesia of skin: Secondary | ICD-10-CM

## 2024-07-17 DIAGNOSIS — R531 Weakness: Secondary | ICD-10-CM

## 2024-07-17 NOTE — Progress Notes (Unsigned)
 Chief Complaint  Patient presents with   Numbness    Right side, Pt in room 14, with father figure, happened after 2 pulmonary embolism , on blood thinners, has lost feeling on whole left side       ASSESSMENT AND PLAN  Becky Gomez is a 33 y.o. female   Right side weakness pain,  Variable effort on examination, I do not see any structural abnormality to explain her difficulty, she was able to maintain right arm and leg posturing with 5 out of 5 motor strength,  MRI of the brain on July 04, 2024, 3 days of symptom onset showed no significant abnormality,  She is very disabled by her current difficulty, desire complete evaluation, repeating MRI of the brain,  EEG  Referred to physical therapy  Refer to [facility] neurology for second opinion,  DIAGNOSTIC DATA (LABS, IMAGING, TESTING) - I reviewed patient records, labs, notes, testing and imaging myself where available.   MEDICAL HISTORY:  [name], seen in request by   Oley Bascom RAMAN, NP [address] [place],  [place] [address], Patient, No Pcp Per   History is obtained from the patient and review of electronic medical records. I personally reviewed pertinent available imaging films in PACS.   PMHx of  Depression,    Right side numbness and weakness, occasional face numbness, even throat, in pain,  This start on August 3rd,   For 3 weeks, waking out metal tasts in her mouth, strong, woek up difficulty breathing, so tried, 6 am, could nto lying flat, faint again,   Husand oticed she is snoing hard to the point to wek him up,   SOB< 10,6, 5,   MRI of brain on August 6th 2025 was normal. MRI cervical, no acute abnormality, C3 hemangioma  Labs on August 10th, WBC 16.1, CMP, normal TSH, ESR, CRP, HIV, UDS, CRP,   Seeing blue shopt  CT angiogram of chest, 1. Evidence of 2 small subsegmental emboli over the right lower lobar pulmonary arteries. 2. Nodular airspace process over the left lower lobe  with the largest focus measuring 2.3 cm. Findings may be due to an infectious or inflammatory process. Recommend follow-up CT 4-6 weeks. 3. Posterior bibasilar dependent opacification likely atelectasis, although could not exclude infection. 4. 1.1 cm right hilar lymph node likely reactive.  She noticed numness thes ame night,   Weakness, she can still move,   Dramatic,   20 doctors, in 5 years.   Past Medical History:  Diagnosis Date   Asthma    Bronchitis    Depression    Headache    Hypoglycemia    Past Surgical History:  Procedure Laterality Date   CHOLECYSTECTOMY       PHYSICAL EXAM:   Vitals:   07/17/24 1451  BP: 118/82  Pulse: 88      PHYSICAL EXAMNIATION:  Gen: NAD, conversant, well nourised, well groomed                     Cardiovascular: Regular rate rhythm, no peripheral edema, warm, nontender. Eyes: Conjunctivae clear without exudates or hemorrhage Neck: Supple, no carotid bruits. Pulmonary: Clear to auscultation bilaterally   NEUROLOGICAL EXAM:  MENTAL STATUS: Speech/cognition: Awake, alert, oriented to history taking and casual conversation CRANIAL NERVES: CN II: Visual fields are full to confrontation. Pupils are round equal and briskly reactive to light. CN III, IV, VI: extraocular movement are normal. No ptosis. CN V: Facial sensation is  intact to light touch CN VII: Face is symmetric with normal eye closure  CN VIII: Hearing is normal to causal conversation. CN IX, X: Phonation is normal. CN XI: Head turning and shoulder shrug are intact  MOTOR: Variable effort on examination, with encouragement, is able to hold right arm against resistance, 5 out of 5 strength, so was the right lower extremity  REFLEXES: Reflexes are 2  and symmetric at the biceps, triceps, knees, and ankles. Plantar responses are flexor.  SENSORY: Intact to light touch, pinprick and vibratory sensation are intact in fingers and toes.  COORDINATION: There is no  trunk or limb dysmetria noted.  GAIT/STANCE: Deferred  REVIEW OF SYSTEMS:  Full 14 system review of systems performed and notable only for as above All other review of systems were negative.   ALLERGIES: Allergies  Allergen Reactions   Penicillins Nausea And Vomiting    Has patient had a PCN reaction causing immediate rash, facial/tongue/throat swelling, SOB or lightheadedness with hypotension: no Has patient had a PCN reaction causing severe rash involving mucus membranes or skin necrosis: no Has patient had a PCN reaction that required hospitalization: no Has patient had a PCN reaction occurring within the last 10 years: no If all of the above answers are NO, then may proceed with Cephalosporin use.    Pineapple Hives    HOME MEDICATIONS: Current Outpatient Medications  Medication Sig Dispense Refill   apixaban  (ELIQUIS ) 5 MG TABS tablet Take 2 tablets (10 mg total) by mouth 2 (two) times daily for 6 days, THEN 1 tablet (5 mg total) 2 (two) times daily. (Patient taking differently: Take 2 tablets (10 mg total) by mouth 2 (two) times daily for 6 days, THEN 1 tablet (5 mg total) 2 (two) times daily.) 204 tablet 0   diclofenac  Sodium (VOLTAREN ) 1 % GEL Apply 2 g topically 3 (three) times daily for 17 days. 100 g 0   gabapentin  (NEURONTIN ) 100 MG capsule Take 1 capsule (100 mg total) by mouth 3 (three) times daily for 10 days. 90 capsule 2   No current facility-administered medications for this visit.    PAST MEDICAL HISTORY: Past Medical History:  Diagnosis Date   Asthma    Bronchitis    Depression    Headache    Hypoglycemia     PAST SURGICAL HISTORY: Past Surgical History:  Procedure Laterality Date   CHOLECYSTECTOMY      FAMILY HISTORY: No family history on file.  SOCIAL HISTORY: Social History   Socioeconomic History   Marital status: Married    Spouse name: Not on file   Number of children: Not on file   Years of education: Not on file   Highest  education level: Not on file  Occupational History   Not on file  Tobacco Use   Smoking status: Never   Smokeless tobacco: Never  Substance and Sexual Activity   Alcohol use: No   Drug use: No   Sexual activity: Yes  Other Topics Concern   Not on file  Social History Narrative   Not on file   Social Drivers of Health   Financial Resource Strain: Not on file  Food Insecurity: No Food Insecurity (07/01/2024)   Hunger Vital Sign    Worried About Running Out of Food in the Last Year: Never true    Ran Out of Food in the Last Year: Never true  Transportation Needs: No Transportation Needs (07/01/2024)   PRAPARE - Transportation    Lack of Transportation (  Medical): No    Lack of Transportation (Non-Medical): No  Physical Activity: Not on file  Stress: Not on file  Social Connections: Unknown (04/02/2022)   Received from [facility]   Social Network    Social Network: Not on file  Intimate Partner Violence: Not At Risk (07/07/2024)   Received from [facility]   Humiliation, Afraid, Rape, and Kick questionnaire    Within the last year, have you been afraid of your partner or ex-partner?: No    Within the last year, have you been humiliated or emotionally abused in other ways by your partner or ex-partner?: No    Within the last year, have you been kicked, hit, slapped, or otherwise physically hurt by your partner or ex-partner?: No    Within the last year, have you been raped or forced to have any kind of sexual activity by your partner or ex-partner?: No      Modena Callander, M.D. Ph.D.  [facility] [address] [place], [place] [address] Ph: [phone] Fax: [phone]  CC:  Oley Bascom RAMAN, NP [phone] [place],  [place] [address]  Patient, No Pcp Per

## 2024-07-20 ENCOUNTER — Telehealth: Payer: Self-pay | Admitting: Neurology

## 2024-07-20 ENCOUNTER — Other Ambulatory Visit (HOSPITAL_COMMUNITY): Payer: Self-pay

## 2024-07-20 NOTE — Telephone Encounter (Signed)
 Patient asked if she could get her MRI at [facility], but since she uses a wheelchair I sent her order to [facility] because she says she can transfer herself. [phone]

## 2024-07-23 ENCOUNTER — Telehealth: Payer: Self-pay | Admitting: Neurology

## 2024-07-23 NOTE — Telephone Encounter (Signed)
 Patient cancelled due to stated, do not need appointment

## 2024-07-23 NOTE — Telephone Encounter (Signed)
Referral for neurology fax to [facility]. Phone: [phone], Fax: [phone]

## 2024-07-25 ENCOUNTER — Other Ambulatory Visit: Payer: Self-pay | Admitting: *Deleted

## 2024-08-02 NOTE — Unmapped External Note (Signed)
 General Outpatient Physical Therapy Evaluation  Payor: [employer] / Plan: BLUE LOCAL WITH [facility] INDIVIDUAL / Product Type: POS /   Visit Count: 1  Demographics:  Age: 33 y.o.  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 She reports persistent headaches, pain in the right upper extremity and lower extremity with active and passive range of motion, pain fear, decreased range of motion, and increased muscle tightness affecting the right upper and lower extremities, as well as the lumbothoracic region. These impairments have led to reduced mobility, increased fall risk, and a growing dependence on others for self-care and household tasks. Her condition warrants comprehensive rehabilitation to address neuromuscular deficits and restore functional independence.  Significant Past Medical History:  Medical History[1]  Concurrent Services:  None  Therapy within Past Year:  no   Medications:   Current Outpatient Medications:  .  albuterol  HFA (PROVENTIL  HFA;VENTOLIN  HFA;PROAIR  HFA) 90 mcg/actuation inhaler, Inhale 2 puffs every 6 (six) hours as needed. (Patient not taking: Reported on 07/20/2024) .  apixaban  (ELIQUIS ) 5 mg tab, Take 5 mg by mouth 2 (two) times a day. .  diclofenac  sodium (VOLTAREN ) 1 % gel, Apply 1 g topically 4 (four) times a day. .  gabapentin  (NEURONTIN ) 100 mg capsule, Take 100 mg by mouth 3 (three) times a day. Allergies:   Allergies as of 08/02/2024 - Reviewed 07/20/2024  Allergen Reaction  Noted  . Nonoxynol 9 Rash 08/05/2017  . Penicillin GI Intolerance 07/19/2024  . Pineapple Angioedema 07/19/2024     Rehabilitation Precautions/Restrictions:   Precautions/Restrictions Precautions: Fall risk         SUBJECTIVE See HPI  Fall Risk Screen:  Fall in the last year?: No Feel unsteady or off balance?: Yes Current Functional Limitations: Any functional activities Patient Stated Goals: Reuse of the right side of my body Pain:  Pain Assessment Pain Assessment: 0-10 Pain Score  : 4 Pain Type: Acute pain Pain Location: Generalized Pain Orientation: Right Pain Descriptors: Aching, Sore, Tightness Pain Frequency: Constant/Continuous Pain Onset: Ongoing Clinical Progression: Not changed Effect of Pain on Daily Activities: Severe Patient's Stated Pain Goal: No pain Pain Intervention(s): Medication (See MAR) Home Environment: Lives with husband and 3 children.  Split-level, with 9 steps. Equipment Owned:  None Exercise History:    Work Status:         OBJECTIVE  General Observation:  The patient ambulated into the clinic with moderate distress, demonstrating an impaired gait pattern characterized by limited range of motion in the right lower extremity, absent arm swing on the right upper extremity, reduced heel strike on the left, and shortened step length. Postural assessment revealed bilateral glenohumeral internal rotation, a mild increase in thoracic kyphosis, and a forward head posture.  posture:  See General Observation above for posture assessment  Range of Motion:  RLE ROM RLE Assessment:  (Right hamstrings length: Less than 40 degrees) LLE ROM LLE Assessment:  (Left hamstring strength: 55 degrees)   Active range of motion is limited-patient is apprehensive to the increase in the pain with active range of motion of both upper extremity and lower extremity. -Passive range of motion of  RUE and LUE is within normal limits.  Noted patient resistance to the  passive range of motion due to the fear of pain  Strength:  RLE Strength RLE Overall Strength: Deficits R Hip Flexion L2 (L2-L3): 3+/5 R Hip Extension L5-S1: 3+/5 R Hip ABduction L5 (L4-L5): 3+/5 R Hip ADduction L2-L3: 3+/5 R Hip Internal Rotation: 3+/5 R Hip External Rotation: 3+/5 R Knee Flexion S1 (L5-S1): 3+/5 R Knee Extension L3 (L4-L5): 4-/5 R Ankle Dorsiflexion L4 (L4-L5): 3/5 R Ankle Plantar Flexion S1 (S1-S2): 4-/5 LLE Strength LLE Overall Strength: Within Functional Limits - strength 5/5     Sensation: Numbness and tingling in RLE and RUE   Motor reflexes in the right lower extremity are within normal limits. Patellar reflex (knee jerk): 2+ Achilles reflex (ankle jerk): 2+ No asymmetry or pathological reflexes noted. No clonus  Functional Mobility:   Bed Mobility Rolling: Supervision Supine to Sit: Supervision Sit to Supine: Supervision Transfers Sit to Stand: Supervision Stand to Sit: Supervision   Balance: Balance Sitting - Static: Maintains w/o support, accepts max. challenge Sitting - Dynamic: Maintains w/o support, accepts mod. challenges & can weight shift, but limitations are evident Standing - Static: Able to maintain without support Standing - Dynamic: Requires support  Locomotion/Gait/Ambulation:  Mobility Distance: Short distances Gait Assistance: Contact guard assistance, Supervision Assistive Device: None Gait Pattern: Decrease velocity, Decrease heel strike (Right side)     Special Tests:  Knee Patella Femoral Grind: Right Positive  Palpation: Within normal limits  Gross Motor Coordination:  Grossly Intact  Outcomes:  PT Outcome Measures    Flowsheet Row Value  Activities-Specific Balance Confidence Scale (ABC)   Walking Around the House 40 %  Walking Up or Down Stairs 30 %  Picking Up Slipper From Closet Floor 10 %  Reaching for Small Can Off Shelf at Eye Level 90 %  Standing on Tiptoes and Reaching Above Head 0 %  Standing  on Chair and Reaching for Something 0 %  Sweeping the Floor 40 %  Walking to Car Parked in Driveway 80 %  Getting into or out of a Car 80 %  Walking Across Parking Lot to the [employer] 50 %  Walking Up or Down Ramp 60 %  Walking in Crowded [place] While People Walk Past 30 %  Being Bumped into While Walking Through Mall 20 %  Stepping onto or Off Escalator While Holding Railing 0 %  Stepping onto or Off Escalator While Holding Parcels 0 %  Walking Outside on Icy Sidewalks 0 %  Total Confidence Level (Average) 33.13 %         Interventions:  Manual therapy: 6 minutes -Passive stretching for hamstrings, gastrocnemius, soleus in order to improve muscle flexibility and joint range of motion. - Passive PNF D1-D2 flexion extension range of motion in supine-RLE to improve joint range of motion and flexibility. Therapeutic exercise: 24 minutes - Passive range of motion in supine for RLE and RUE to improve muscle flexibility, joint range of motion.  Constant cues required to facilitate patient ability to let go-to facilitate improved joint range of motion. - Exercises including Access Code: [email or web address] URL: [email or web address] Date: 08/02/2024 Prepared by: Laqueta Foley  Exercises - Supine Bridge  - 2 x daily - 7 x weekly - 2 sets - 10 reps - Supine Heel Slide  - 2 x daily - 7 x weekly - 2 sets - 10 reps - Small Range SLR  - 2 x daily - 7 x weekly - 2 sets -  10 reps - Supine Hip Abduction  - 2 x daily - 7 x weekly - 2 sets - 10 reps - Bent Knee Fallouts  - 2 x daily - 7 x weekly - 2 sets - 10 reps - Sit to Stand with Arms Crossed  - 2 x daily - 7 x weekly - 2 sets - 10 reps - Hooklying Clamshell with Resistance  - 2 x daily - 7 x weekly - 2 sets - 10 reps - Seated Gastroc Stretch with Strap  - 2 x daily - 7 x weekly - 2 reps - 10-15 sec hold .Pt educated and assisted throughout the session on proper form and technique and utilized various forms of cuing in order to obtain  correct posture when performing exercises   Education:    Yes, provided as follows:   Barriers to Learning:  No Barriers   Learning Needs:  Clinic/Hospital, Illness/disease, Treatment plan, Rehabilitation techniques and procedures, Energy Conservation Techniques, Pain Management, Safety, Precautions, and Posture Correction   Education Provided:  Per learning needs listed above   Audience / Response:  Patient and Caregiver  Applied knowledge, Verbalized understanding, Demonstrated skill, Needs practice, and Needs reinforcement   Mode:  Explanation, Demonstration, Printed material provided, and Video provided   Interpreter Utilized: N/A      ASSESSMENT  The patient is a 33 year old female presenting with decreased mobility and impaired functional use of the right upper and lower extremities following stroke-like symptoms. Clinical assessment revealed pelvic asymmetry, with the right iliac crest elevated relative to the left, contributing to an apparent functional leg length discrepancy (left longer than right). Musculoskeletal examination identified increased muscle tightness in the right gastrocnemius, soleus, hamstrings, quadriceps, and hip flexors, resulting in restricted active and passive range of motion (AROM and PROM) and associated discomfort, likely secondary to prolonged immobility.  Despite these limitations, the patient demonstrates preserved motor function in both the right upper and lower extremities, evidenced by partial voluntary movement, though not yet within functional limits. Passive range of motion and proprioceptive neuromuscular facilitation (PNF) techniques applied during the session yielded notable improvements in right lower extremity mobility, including full knee extension and hip flexion/extension.  The patient exhibits anticipatory pain behaviors during active movement, which may be contributing to guarded mobility and compensatory patterns. Additionally, postural  deviations such as lumbar and thoracic stiffness, paraspinal muscle tightness, and quadratus lumborum (QL) hypertonicity suggest a risk for developing functional scoliosis and vertebral malalignment if left unaddressed.  The patient will benefit from a comprehensive rehabilitation program including therapeutic exercise, neuromuscular re-education, active and passive stretching, and core stabilization to improve joint mobility, muscle flexibility, and postural alignment. A home exercise program (HEP) was discussed and the patient demonstrated understanding and willingness to comply.  Therapy Diagnosis:     ICD-10-CM   1. Decreased range of motion  M25.60   2. Decreased strength, endurance, and mobility  R53.1, Z74.09, R68.89   3. Balance problem  R26.89   4. Abnormal increased muscle tightness  M62.89    Problem List: Impairment List: Activity tolerance; Fall risk; Joint stiffness; Pain; Motor planning; Safety awareness; Soft tissue dysfunction; Balance; Functional limitations; Obesity; Patient/family education; Strength; Judgement; Gait; Knowledge of condition; Proprioception/kinesthetic deficits; Tone; Endurance; Mobility; Range of motion    Equipment Needed:  None  Other Rehabilitation Considerations:  See Rehab Precautions  Response to Evaluation:  The session was tolerated well, as evidenced by no increase in the pain  Rehabilitation Potential:    Motivation/Commitment to Therapy:  Good  Rehabilitation Potential:  Good   Support Structure:  Good  Friend willing to assist  Goals:    Goals Addressed             This Visit's Progress   . PT Goal       ..LTG: Patient will improve their ABC Scale score by at least 11 - 19 points, to reflect increased confidence in balance, Functionally, the patient will perform independent transfers, walk 300 m on both even and uneven surfaces without losing balance, safely navigate a flight of 10 stairs without handrails and participate in  community activities without fear of falling by 09/14/2024  .ABC Scale Activities-Specific Balance Confidence Scale (ABC) Walking Around the House: 40 % Walking Up or Down Stairs: 30 % Picking Up Slipper From Closet Floor: 10 % Reaching for Small Can Off Shelf at Eye Level: 90 % Standing on Tiptoes and Reaching Above Head: 0 % Standing on Chair and Reaching for Something: 0 % Sweeping the Floor: 40 % Walking to Car Parked in Driveway: 80 % Getting into or out of a Car: 80 % Walking Across Parking Lot to the [place]: 50 % Walking Up or Down Ramp: 60 % Walking in [place] While People Walk Past: 30 % Being Bumped into While Walking Through Mall: 20 % Stepping onto or Off Escalator While Holding Railing: 0 % Stepping onto or Off Escalator While Holding Parcels: 0 % Walking Outside on [place] Sidewalks: 0 % Total Confidence Level (Average): 33.13 %      . PT Goal       .LTG: Patient will improve muscle strength to at least 4+/5 on the MMT scale in the right hip, knee, and ankle joints to enhance functional mobility, stability, and overall functional capacity for activities of daily living, household activities and self-care by 09/14/2024    . PT Goal       .LTG: Patient will report a reduction in headache frequency and intensity by at least 50% and demonstrate improved cervical spine range of motion (ROM) and decreased neck stiffness to allow pain-free participation in daily activities by 09/14/2024    . PT Goal       LTG: Patient will achieve 4+/5 muscle strength in the right upper extremity by 09/14/2024, with improved shoulder stability, absence of pain or compensatory movements, and the ability to perform functional overhead, reaching, and lifting tasks. This includes lifting 5 pounds overhead and reaching behind the back without discomfort or instability.         PLAN Treatment Frequency and Duration: PT Frequency: 2x/week PT Duration: 6 weeks Treatment Plan Details: Plan of  care ending 09/14/2024  Recommended PT Treatment/Interventions: Electrical stimulation-attended (02967); Electrical stimulation-unattended (02985); Gait training (02883); Caregiver Training ([id number]); Neuromuscular re-education [phone]); Self-care/home management [phone]); Therapeutic activity (97530); Therapeutic exercise (97110); Manual therapy (97140); Therapeutic massage [phone]); Ultrasound (02964)   Necessity: Required to return to Premorbid environment (or reside in new living environment)?  yes Required to reduce ADL or IADL assistance to Premorbid level?  no  Recommended Consults:  None currently  Development of Plan of Care:  Patient participated in plan of care development.  Total Treatment Time (Time & Untimed): Total Treatment minutes: 50 Total Time in Timed Codes: Timed Code Minutes: 30 PT Eval Low Complexity minutes: 20   PT Treatment/Procedure Therapeutic Exercises minutes: 24 Manual Therapy Minutes: 6           PT Eval Complexity: History of Comorbidities/Personal Care Impacting Plan of Care: 1-2  personal factors and/or comorbidities Examination of Body Systems: 3 or more elements Presentation of Characteristics: Stable and/or uncomplicated characteristics PT Eval Complexity Score: Low Complexity     The patient has been instructed to contact our clinic if any questions or problems should arise.       [1] Past Medical History: Diagnosis Date  . Asthma (CMD)   . Depression   . Pulmonary embolism    (CMD)

## 2024-08-23 ENCOUNTER — Ambulatory Visit

## 2024-08-29 ENCOUNTER — Encounter (HOSPITAL_BASED_OUTPATIENT_CLINIC_OR_DEPARTMENT_OTHER): Payer: Self-pay

## 2024-08-29 ENCOUNTER — Ambulatory Visit (INDEPENDENT_AMBULATORY_CARE_PROVIDER_SITE_OTHER)

## 2024-08-29 ENCOUNTER — Ambulatory Visit: Payer: Self-pay

## 2024-08-29 ENCOUNTER — Ambulatory Visit (HOSPITAL_BASED_OUTPATIENT_CLINIC_OR_DEPARTMENT_OTHER)
Admission: RE | Admit: 2024-08-29 | Discharge: 2024-08-29 | Disposition: A | Discharge status: Home / Self Care | Source: Ambulatory Visit

## 2024-08-29 VITALS — BP 107/72 | HR 80 | Temp 97.8°F | Ht 65.0 in | Wt 245.5 lb

## 2024-08-29 DIAGNOSIS — Z8709 Personal history of other diseases of the respiratory system: Secondary | ICD-10-CM

## 2024-08-29 DIAGNOSIS — R918 Other nonspecific abnormal finding of lung field: Secondary | ICD-10-CM

## 2024-08-29 DIAGNOSIS — Z7901 Long term (current) use of anticoagulants: Secondary | ICD-10-CM | POA: Diagnosis present

## 2024-08-29 DIAGNOSIS — R590 Localized enlarged lymph nodes: Secondary | ICD-10-CM | POA: Insufficient documentation

## 2024-08-29 DIAGNOSIS — R531 Weakness: Secondary | ICD-10-CM

## 2024-08-29 DIAGNOSIS — Z6841 Body Mass Index (BMI) 40.0 and over, adult: Secondary | ICD-10-CM

## 2024-08-29 DIAGNOSIS — Z86711 Personal history of pulmonary embolism: Secondary | ICD-10-CM | POA: Diagnosis present

## 2024-08-29 DIAGNOSIS — R0602 Shortness of breath: Secondary | ICD-10-CM | POA: Insufficient documentation

## 2024-08-29 DIAGNOSIS — R438 Other disturbances of smell and taste: Secondary | ICD-10-CM

## 2024-08-29 MED ORDER — APIXABAN 5 MG PO TABS: 5.0000 mg | ORAL_TABLET | Freq: Two times a day (BID) | ORAL | 3 refills | Status: AC

## 2024-08-29 MED ORDER — IOHEXOL 350 MG/ML SOLN
100.0000 mL | Freq: Once | INTRAVENOUS | Status: AC | PRN
  Administered 2024-08-29: 100 mL via INTRAVENOUS

## 2024-08-29 NOTE — Unmapped External Note (Signed)
 Physical Therapy Progress Note Report  Payor: BCBS / Plan: BLUE LOCAL WITH [facility] INDIVIDUAL / Product Type: POS /   Demographics:  Age: 33 y.o.  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 Decreased range of motion  M25.60   2. Decreased strength, endurance, and mobility  R53.1, Z74.09, R68.89   3. Balance problem  R26.89   4. Abnormal increased muscle tightness  M62.89     Rehabilitation Precautions/Restrictions:   Precautions/Restrictions Precautions: Fall risk    Interval Medical History: none significant  Problem List: Activity tolerance; Fall risk; Joint stiffness; Pain; Motor planning; Safety awareness; Soft tissue dysfunction; Balance; Functional limitations; Obesity; Patient/family education; Strength; Judgement; Gait; Knowledge of condition; Proprioception/kinesthetic deficits; Tone; Endurance; Mobility; Range of motion    SUBJECTIVE Patient reports right knee pain following prolonged walking during her son's field trip. She was unable to continue due to increased discomfort. Patient states she is compliant with her home exercise program (HEP).  Pain:  Pain Assessment Pain Assessment: 0-10 Pain Score  : 3 Pain Type: Chronic pain Pain Location: Knee Pain Orientation: Right Pain Descriptors: Aching Pain Frequency: Constant/Continuous Pain Onset: Ongoing Clinical Progression: Not changed Effect of Pain on Daily Activities: Moderate Patient's Stated Pain Goal: No pain Pain Intervention(s): Medication (See MAR)  OBJECTIVE  General Observation:  walked in with no distress.  RLE Strength R Hip  Flexion L2 (L2-L3): 4+/5 R Hip Extension L5-S1: 4+/5 R Hip ABduction L5 (L4-L5): 4+/5 R Hip ADduction L2-L3: 4+/5 R Hip Internal Rotation: 4+/5 R Hip External Rotation: 4+/5 R Knee Flexion S1 (L5-S1): 4/5 R Knee Extension L3 (L4-L5): 4+/5 R Ankle Dorsiflexion L4 (L4-L5): 4/5 R Ankle Plantar Flexion S1 (S1-S2): 4+/5 LLE Strength LLE Overall Strength: Within Functional Limits - strength 5/5 Bed Mobility Rolling: Independent Supine to Sit: Independent Sit to Supine: Independent Transfers Sit to Stand: Independent Stand to Sit: Independent Balance Sitting - Static: Maintains w/o support, accepts max. challenge Sitting - Dynamic: Maintains w/o support, accepts max. challenge and weight shifts in all directions Standing - Static: Maintains w/o support, accepts max. challenge and weight shifts in all directions Standing - Dynamic: Able to maintain without support Mobility Gait Assistance: Independent Assistive Device: None Gait Pattern: Decrease heel strike   Special Tests:  Knee Patella Femoral Grind: Right Positive  Outcomes:  PT Outcome Measures    Flowsheet Row Value  Activities-Specific Balance Confidence Scale (ABC)   Walking Around the House 60 %  Walking Up or Down Stairs 40 %  Picking Up Slipper From Closet Floor 90 %  Reaching for Small Can Off Shelf at Eye Level 90 %  Standing on Tiptoes and Reaching Above Head 20 %  Standing on Chair and Reaching for Something 10 %  Sweeping the Floor 60 %  Walking to Car Parked in Driveway 90 %  Getting into or out of a Car 90 %  Walking Across Parking Lot to the [employer] 90 %  Walking Up or Down Ramp 90 %  Walking in Crowded [place] While People Walk Past 90 %  Being Bumped into While Walking Through Mall 80 %  Stepping onto or Off Escalator While [employer]  Railing 40 %  Stepping onto or Off Escalator While Holding Parcels 20 %  Walking Outside on [place] Sidewalks 10 %  Total Confidence Level (Average) 60.63 %   Palpation:  tenderness to palpation of R pes anserine   Interventions:  Therapeutic Exercise: 32 minutes Possible Pes anserine tendinitis with associated right lower extremity weakness. Intervention Summary: Treadmill Walking - 8 minutes at 1.6-1.8 mph Patient engaged in low-speed treadmill walking to promote cardiovascular endurance, gait retraining, and pain desensitization. This activity supports gradual loading of the pes anserine tendon complex (sartorius, gracilis, semitendinosus) while improving neuromuscular control of the right lower extremity. Used bilateral UE support intermittently for balance and offloading. Required verbal cues to improve step length, heel strike, and upright posture, addressing compensatory gait patterns due to weakness. Reported RPE of 4/10 on the Modified Borg Scale. Demonstrated consistent and controlled gait mechanics with no signs of symptom exacerbation. Tolerance to treadmill walking has improved since the previous session. Plan: Progress treadmill duration to 10 minutes at same speed next session to enhance endurance and tendon load tolerance. Step-Ups Performed to improve functional strength and eccentric control of the quadriceps and hamstrings, targeting the pes anserine region and addressing stair-related pain. Piriformis Stretch - 3  30 sec Included to reduce compensatory pelvic tightness and improve hip mobility, indirectly supporting knee alignment and reducing medial knee stress. Long Arc Quads (LAQs) - 3 sets  15 reps (toe outward) Targeted quadriceps strengthening with external rotation to emphasize vastus medialis oblique (VMO) activation, aiding patellar tracking and medial knee support. Hamstring Curls - 3 sets  15 reps Strengthening of the semitendinosus (part of pes anserine group) to improve posterior chain support and reduce strain on the medial knee. Self Hamstring Stretch - 3  30 sec Aimed at reducing tension on the pes anserine tendons,  improving flexibility and decreasing friction at the bursa. Goal: Improve right lower extremity strength, flexibility, and gait mechanics to reduce pain and functional limitations associated with pes anserine tendinitis and right-sided weakness.  Modalities:  .Ultrasound: pulsed US  1 MHz, 1.5 W/cm2 at 20 % to right pes anserine region reduce pain/inflammation and promote healing     Time: 8 min,  .Pt educated and assisted throughout the session on proper form and technique and utilized various forms of cuing in order to obtain correct posture when performing exercises   Education: Yes, as described in interventions    ASSESSMENT Assessment: Patient continues to present with instability in the right knee, hip, and ankle, contributing to impaired gait and increased fall risk. She reports persistent pain in the right lower extremity. Palpation revealed tenderness over the right pes anserine region, likely due to muscle tightness and abnormal gait mechanics. These findings are consistent with pes anserine tendinitis and right-sided weakness. Patient is scheduled to begin occupational therapy for right upper extremity and hand rehabilitation. Therapeutic ultrasound was administered to the pes anserine area to reduce inflammation and promote tissue healing. Patient was instructed in targeted stretching and icing techniques for pain management. She verbalized understanding of the interventions and home care instructions. Continued physical therapy is indicated to address: Strength and flexibility deficits Gait and balance impairments Fall risk reduction Proper progression of therapeutic exercise and manual therapy  Therapy Program to Date: In summary, the therapy program has included: Patient and/or Caregiver Education, Investment banker, operational, Neuromuscular Re-Ed, Therapeutic Exercises, Therapeutic Activities, Manual Therapy, and Modalities  Summary of Care Provided during this Interval: improved BLE  strength. Improve balance. No falls so far, improved ABC  score.   Responses to Treatment Interventions: Additional improvement anticipated within reasonable timeframe  Progress Towards Goals:   Goals Addressed             This Visit's Progress   . PT Goal   Improving    .SABRALTG: Patient will improve their ABC Scale score by at least 11 - 19 points, to reflect increased confidence in balance, Functionally, the patient will perform independent transfers, walk 300 m on both even and uneven surfaces without losing balance, safely navigate a flight of 10 stairs without handrails and participate in community activities without fear of falling by 09/14/2024  .ABC Scale Activities-Specific Balance Confidence Scale (ABC) Walking Around the House: 40 % Walking Up or Down Stairs: 30 % Picking Up Slipper From Closet Floor: 10 % Reaching for Small Can Off Shelf at Eye Level: 90 % Standing on Tiptoes and Reaching Above Head: 0 % Standing on Chair and Reaching for Something: 0 % Sweeping the Floor: 40 % Walking to Car Parked in Driveway: 80 % Getting into or out of a Car: 80 % Walking Across Parking Lot to the [place]: 50 % Walking Up or Down Ramp: 60 % Walking in Crowded [place] While People Walk Past: 30 % Being Bumped into While Walking Through Mall: 20 % Stepping onto or Off Escalator While Holding Railing: 0 % Stepping onto or Off Escalator While Holding Parcels: 0 % Walking Outside on [place] Sidewalks: 0 % Total Confidence Level (Average): 33.13 %   08/28/24  .ABC Scale Activities-Specific Balance Confidence Scale (ABC) Walking Around the House: 60 % Walking Up or Down Stairs: 40 % Picking Up Slipper From Closet Floor: 90 % Reaching for Small Can Off Shelf at Eye Level: 90 % Standing on Tiptoes and Reaching Above Head: 20 % Standing on Chair and Reaching for Something: 10 % Sweeping the Floor: 60 % Walking to Car Parked in Driveway: 90 % Getting into or out of a Car: 90 % Walking Across  Parking Lot to the [place]: 90 % Walking Up or Down Ramp: 90 % Walking in Crowded [place] While People Walk Past: 90 % Being Bumped into While Walking Through Mall: 80 % Stepping onto or Off Escalator While Holding Railing: 40 % Stepping onto or Off Escalator While Holding Parcels: 20 % Walking Outside on [place] Sidewalks: 10 % Total Confidence Level (Average): 60.63 %   ongoing     . PT Goal   Improving    .LTG: Patient will improve muscle strength to at least 4+/5 on the MMT scale in the right hip, knee, and ankle joints to enhance functional mobility, stability, and overall functional capacity for activities of daily living, household activities and self-care by 09/14/2024    . PT Goal   Improving    .LTG: Patient will report a reduction in headache frequency and intensity by at least 50% and demonstrate improved cervical spine range of motion (ROM) and decreased neck stiffness to allow pain-free participation in daily activities by 09/14/2024    . PT Goal   Improving    LTG: Patient will achieve 4+/5 muscle strength in the right upper extremity by 09/14/2024, with improved shoulder stability, absence of pain or compensatory movements, and the ability to perform functional overhead, reaching, and lifting tasks. This includes lifting 5 pounds overhead and reaching behind the back without discomfort or instability.          PLAN Treatment Frequency and Duration:  PT Frequency: 2x/week PT Duration: 6 weeks Treatment Plan  Details: 12 visits  Recommended PT Treatment/Interventions: Electrical stimulation-attended (02967); Electrical stimulation-unattended (02985); Gait training (02883); Caregiver Training ([id number]); Neuromuscular re-education [phone]); Self-care/home management [phone]); Therapeutic activity (97530); Therapeutic exercise (97110); Manual therapy (97140); Therapeutic massage [phone]); Ultrasound (02964)   Recommended Consults:  None currently  Development of Plan of  Care:  Patient participated in plan of care development.  Total Treatment Time (Time & Untimed): Total Treatment minutes: 40 Total Time in Timed Codes: Timed Code Minutes: 40   PT Modalities Ultrasound minutes: 8 PT Treatment/Procedure Therapeutic Exercises minutes: 32              The patient has been instructed to contact our clinic if any questions or problems should arise.

## 2024-08-29 NOTE — Progress Notes (Addendum)
 New Patient Pulmonology Office Visit   Subjective:  Patient ID: Becky Gomez, female    DOB: 11/05/1991  MRN: [id number]  Referred by: Oley Bascom RAMAN, NP  CC:  Chief Complaint  Patient presents with   Consult    2 blood clots in lungs     HPI Becky Gomez is a 33 y.o. female who is referred to this clinic for multiple PE.  Patient was admitted to the hospital at the beginning of August this year for multiple syncopal episodes, ongoing shortness of breath, chest tightness.  Patient reports he had those symptoms for almost 3 weeks.  On the third week, she had 3-4 syncopal episodes, also had significant shortness of breath at rest and with exertion and chest tightness.  She was previously diagnosed with pregnancy related asthma.  She also reports a strong history of asthma in her biological family.  She believes she had ongoing bronchitis/viral infection, however as symptoms progressed and worsened she presented to ED, where she was found to have elevated troponin and small subsegmental PE.  She has been on Eliquis  since then Factor V Leiden was negative She was not on contraceptive pills during this time. Denies prior history of blood clots  Also during the same hospital admissions, she developed progressively worsening right sided weakness and numbness and underwent stroke evaluation.  Her symptoms progressed after discharge and she was seen by neurology outpatient.  More recently she had seen [facility] neurology.  As per their note, discharge note from 07/19/2024, she was diagnosed with functional neurological disorders.  Patient reports she received steroid twice and steroids seem to help her symptoms some  She reports her weakness has improved but not resolved completely.  She is following with physical therapy closely  She still reports shortness of breath, worse with sneezing Prior lab work during inpatient stay has shown high eosinophils  She was adopted when she was  33 years old.  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  She lives with her husband, who she reports is very supportive of her    ROS Review of symptoms negative except mentioned above  Allergies: Penicillins and Pineapple  Current Outpatient Medications:    apixaban  (ELIQUIS ) 5 MG TABS tablet, Take 1 tablet (5 mg total) by mouth 2 (two) times daily., Disp: 60 tablet, Rfl: 3   gabapentin  (NEURONTIN ) 100 MG capsule, Take 1 capsule (100 mg total) by mouth 3 (three) times daily for 10 days., Disp: 90 capsule, Rfl: 2 Past Medical History:  Diagnosis Date   Asthma    Bronchitis    Depression    Headache    Hypoglycemia    Past Surgical History:  Procedure Laterality Date   CHOLECYSTECTOMY     History reviewed. No pertinent family history. Social History   Socioeconomic History   Marital status: Married    Spouse name: Not on file   Number of children: Not on file   Years of education: Not on file   Highest education level: Not on file  Occupational History   Not on file  Tobacco Use   Smoking status: Never   Smokeless tobacco: Never  Substance and Sexual Activity   Alcohol use: No   Drug use: No   Sexual activity: Yes  Other Topics Concern   Not  on file  Social History Narrative   Not on file   Social Drivers of Health   Financial Resource Strain: Not on file  Food Insecurity: No Food Insecurity (07/01/2024)   Hunger Vital Sign    Worried About Running Out of Food in the Last Year: Never true    Ran Out of Food in the Last Year: Never true  Transportation Needs: No Transportation Needs (07/01/2024)   PRAPARE - Administrator, Civil Service (Medical): No    Lack of Transportation (Non-Medical): No  Physical Activity: Not on file  Stress: Not on file  Social Connections: Unknown  (04/02/2022)   Received from [facility]   Social Network    Social Network: Not on file  Intimate Partner Violence: Not At Risk (07/07/2024)   Received from [facility]   Humiliation, Afraid, Rape, and Kick questionnaire    Within the last year, have you been afraid of your partner or ex-partner?: No    Within the last year, have you been humiliated or emotionally abused in other ways by your partner or ex-partner?: No    Within the last year, have you been kicked, hit, slapped, or otherwise physically hurt by your partner or ex-partner?: No    Within the last year, have you been raped or forced to have any kind of sexual activity by your partner or ex-partner?: No         Objective:  BP 107/72   Pulse 80   Temp 97.8 F (36.6 C) (Temporal)   Ht 5' 5 (1.651 m)   Wt 245 lb 8 oz (111.4 kg)   SpO2 100%   BMI 40.85 kg/m    Physical Exam Constitutional:      General: She is not in acute distress.    Appearance: Normal appearance.  HENT:     Mouth/Throat:     Mouth: Mucous membranes are moist.  Cardiovascular:     Rate and Rhythm: Normal rate.  Pulmonary:     Effort: No respiratory distress.     Breath sounds: No wheezing or rales.  Musculoskeletal:     Right lower leg: No edema.     Left lower leg: No edema.  Skin:    General: Skin is warm.  Neurological:     Mental Status: She is alert and oriented to person, place, and time.     Comments: Right arm weakness.  Psychiatric:        Mood and Affect: Mood normal.     Diagnostic Review:    Pft     No data to display         CT 06/2024 1. Evidence of 2 small subsegmental emboli over the right lower lobar pulmonary arteries. 2. Nodular airspace process over the left lower lobe with the largest focus measuring 2.3 cm. Findings may be due to an infectious or inflammatory process. Recommend follow-up CT 4-6 weeks. 3. Posterior bibasilar dependent opacification likely atelectasis, although could not exclude  infection. 4. 1.1 cm right hilar lymph node likely reactive.   MRI head 06/2024 Normal brain MRI. No acute abnormality.       Assessment & Plan:   33 year old unfortunate female who developed multiple episodes of syncope and was found to have subsegmental PE.  Has been having right-sided weakness since then  Patient had ongoing symptoms-weakness, shortness of breath, chest tightness for 2 to 3 weeks prior to the diagnostic Ctpe. Did not have fever or chills. Unsure if the clots  were provoked because of  bronchitis/infection or not.  She did not seek help for 3 weeks despite symptoms as she does not like to visit medical facilities/doctors office.  She is understandably frustrated with multiple medical problems she has had to deal with in the last 2 months.  Thankfully her echo from inpatient admission does not show RV strain. CT had shown more than 2 cm left sided infiltrate/nodular density.  It had also shown hilar lymphadenopathy Assessment & Plan Shortness of breath Likely multifactorial, need to rule out left lung infiltrate has resolved, need to explore if she has asthma, obesity likely contributing as well.  Patient has gained 15 pounds in last year-BMI is 40 The PE were small and peripheral.  If her symptoms remains persistent, may consider echo in the future for possible CTEPH Will obtain CT PE - pt wants to know if her clots have resolved. She also reports sob with deeper breaths and sneezing. Orders:   ANA   ANCA screen with reflex titer   Anti-DNA antibody, double-stranded; Future   Rheumatoid factor   CK   Pulmonary Function Test; Future   6 minute walk; Future   Basic Metabolic Panel (BMET); Future   IgE; Future   CBC with Differential   CT Angio Chest Pulmonary Embolism (PE) W or WO Contrast; Future  History of pulmonary embolism Advised patient to continue Eliquis .  Refills provided Discussed the difference of provoked versus unprovoked PE At some point in the  future patient would like to come off Eliquis .  She is wanting to undergo full hematologic workup when she can safely come off Eliquis . Will send autoimmune labs given significantly elevated eosinophils in the hospital and multiple unexplained symptoms Orders:   ANA   ANCA screen with reflex titer   Anti-DNA antibody, double-stranded; Future   Rheumatoid factor   CK   Pulmonary Function Test; Future   6 minute walk; Future   Basic Metabolic Panel (BMET); Future   IgE; Future   CBC with Differential   CT Angio Chest Pulmonary Embolism (PE) W or WO Contrast; Future  BMI 40.0-44.9, adult (HCC) recommend weight loss with exercise and diet control as able to Orders:   ANA   ANCA screen with reflex titer   Anti-DNA antibody, double-stranded; Future   Rheumatoid factor   CK   Pulmonary Function Test; Future   6 minute walk; Future   Basic Metabolic Panel (BMET); Future   IgE; Future   CBC with Differential  Mass of left lung Ground glass/semisolid Could be inflammatory Can't rule out atypical infections- fungal, aspergillus etc Will f/up in next scan  Orders:   ANA   ANCA screen with reflex titer   Anti-DNA antibody, double-stranded; Future   Rheumatoid factor   CK   Pulmonary Function Test; Future   6 minute walk; Future   Basic Metabolic Panel (BMET); Future   IgE; Future   CBC with Differential   CT Angio Chest Pulmonary Embolism (PE) W or WO Contrast; Future  Hilar adenopathy Likely related to PE/ bronchitis Repeat scan Orders:   ANA   ANCA screen with reflex titer   Anti-DNA antibody, double-stranded; Future   Rheumatoid factor   CK   Pulmonary Function Test; Future   6 minute walk; Future   Basic Metabolic Panel (BMET); Future   IgE; Future   CBC with Differential   CT Angio Chest Pulmonary Embolism (PE) W or WO Contrast; Future  History of asthma May benefit from regular inhaler  use Orders:   ANA   ANCA screen with reflex titer   Anti-DNA antibody,  double-stranded; Future   Rheumatoid factor   CK   Pulmonary Function Test; Future   6 minute walk; Future   Basic Metabolic Panel (BMET); Future   IgE; Future   CBC with Differential  Chronic anticoagulation  Orders:   ANA   ANCA screen with reflex titer   Anti-DNA antibody, double-stranded; Future   Rheumatoid factor   CK   Pulmonary Function Test; Future   6 minute walk; Future   Basic Metabolic Panel (BMET); Future   IgE; Future   CBC with Differential   CT Angio Chest Pulmonary Embolism (PE) W or WO Contrast; Future  Right sided weakness As pre neurology    Metallic taste      Thank you for the opportunity to take part in the care of Becky Gomez   Return in about 9 days (around 09/07/2024).   Harris Penton Pleas, MD [facility] Office: [phone]  I personally spent a total of 65 minutes in the care of the patient today including performing a medically appropriate exam/evaluation, counseling and educating, placing orders, referring and communicating with other health care professionals, documenting clinical information in the EHR, and independently interpreting results.

## 2024-08-29 NOTE — Progress Notes (Signed)
 Patient called.  2 small nodules in left lower lobe, unchanged from Aug scan The left sided nodular infiltrates and subsegmental emboli and hilar lymphadenopathy are not visible anymore.  Pt was appreciative of the call

## 2024-08-29 NOTE — Assessment & Plan Note (Signed)
 [name]

## 2024-08-29 NOTE — Patient Instructions (Signed)
 It was a pleasure to see you today. Please have your labs drawn TODAY as directed Your pulmonary function test and walk test will be scheduled closer to your next follow up visit. Your will receive a call for your CT chest scheduling.

## 2024-09-03 ENCOUNTER — Other Ambulatory Visit: Payer: Self-pay

## 2024-09-03 ENCOUNTER — Emergency Department (HOSPITAL_BASED_OUTPATIENT_CLINIC_OR_DEPARTMENT_OTHER)
Admission: EM | Admit: 2024-09-03 | Discharge: 2024-09-04 | Disposition: A | Discharge status: Home / Self Care | Attending: Emergency Medicine | Admitting: Emergency Medicine

## 2024-09-03 ENCOUNTER — Encounter (HOSPITAL_BASED_OUTPATIENT_CLINIC_OR_DEPARTMENT_OTHER): Payer: Self-pay | Admitting: Urology

## 2024-09-03 ENCOUNTER — Emergency Department (HOSPITAL_BASED_OUTPATIENT_CLINIC_OR_DEPARTMENT_OTHER): Admitting: Radiology

## 2024-09-03 DIAGNOSIS — Z7901 Long term (current) use of anticoagulants: Secondary | ICD-10-CM | POA: Insufficient documentation

## 2024-09-03 DIAGNOSIS — R0789 Other chest pain: Secondary | ICD-10-CM | POA: Insufficient documentation

## 2024-09-03 DIAGNOSIS — I2693 Single subsegmental pulmonary embolism without acute cor pulmonale: Secondary | ICD-10-CM | POA: Diagnosis not present

## 2024-09-03 LAB — BASIC METABOLIC PANEL WITH GFR
Anion gap: 13 (ref 5–15)
BUN: 7 mg/dL (ref 6–20)
CO2: 25 mmol/L (ref 22–32)
Calcium: 10.8 mg/dL — ABNORMAL HIGH (ref 8.9–10.3)
Chloride: 103 mmol/L (ref 98–111)
Creatinine, Ser: 0.83 mg/dL (ref 0.44–1.00)
GFR, Estimated: >60 mL/min (ref >60)
Glucose, Bld: 106 mg/dL — ABNORMAL HIGH (ref 70–99)
Potassium: 3.6 mmol/L (ref 3.5–5.1)
Sodium: 141 mmol/L (ref 135–145)

## 2024-09-03 LAB — CBC
HCT: 39.5 % (ref 36.0–46.0)
Hemoglobin: 12.9 g/dL (ref 12.0–15.0)
MCH: 27.9 pg (ref 26.0–34.0)
MCHC: 32.7 g/dL (ref 30.0–36.0)
MCV: 85.3 fL (ref 80.0–100.0)
Platelets: 356 K/uL (ref 150–400)
RBC: 4.63 MIL/uL (ref 3.87–5.11)
RDW: 13.4 % (ref 11.5–15.5)
WBC: 10 K/uL (ref 4.0–10.5)
nRBC: 0 % (ref 0.0–0.2)

## 2024-09-03 LAB — TROPONIN T, HIGH SENSITIVITY
Troponin T High Sensitivity: <15 ng/L (ref 0–19)
Troponin T High Sensitivity: <15 ng/L (ref 0–19)

## 2024-09-03 NOTE — ED Notes (Signed)
 Patient transported to X-ray

## 2024-09-03 NOTE — ED Triage Notes (Signed)
 Pt states chest pain, SOB and shivering that started approx 30 min ago  Hasn't been feeling well x 1 hour   Recent dx of PE and was in ICU  On eliquis 

## 2024-09-03 NOTE — ED Notes (Signed)
Patient states she can not give a urine sample at this time. 

## 2024-09-03 NOTE — ED Provider Notes (Signed)
 [place]  Provider Note  CSN: [id number] Arrival date & time: 09/03/24 2114  History Chief Complaint  Patient presents with   Chest Pain    Becky Gomez is a 33 y.o. female with history of subsegmental PE found about 2 months ago, started on Eliquis  and has been overall compliant with occasional missed doses. Echo at the time did not show RV strain. She has also had a variety of other symptoms in recent weeks including unilateral weakness, neuropathy, headaches, SOB. She has been evaluated in outpatient by Neurology and referred to PT. She was seen by [facility] for follow up on 10/1 and had a CTA then without PE. There was a nodule that is being evaluated by Pulm.   She reports today around 2000hrs, she began having some SOB and chest soreness, worse with walking up stairs. Improved now. No cough or fever.   Home Medications Prior to Admission medications   Medication Sig Start Date End Date Taking? Authorizing Provider  apixaban  (ELIQUIS ) 5 MG TABS tablet Take 1 tablet (5 mg total) by mouth 2 (two) times daily. 08/29/24   Baral, Dipti, MD  gabapentin  (NEURONTIN ) 100 MG capsule Take 1 capsule (100 mg total) by mouth 3 (three) times daily for 10 days. 07/04/24 08/29/24  Pokhrel, Vernal, MD     Allergies    Penicillins and Pineapple   Review of Systems   Review of Systems Please see HPI for pertinent positives and negatives  Physical Exam BP 111/66   Pulse 79   Temp (!) 97.4 F (36.3 C)   Resp 16   Ht 5' 5 (1.651 m)   Wt 111 kg   LMP 08/25/2024   SpO2 97%   BMI 40.72 kg/m   Physical Exam Vitals and nursing note reviewed.  Constitutional:      Appearance: Normal appearance.  HENT:     Head: Normocephalic and atraumatic.     Nose: Nose normal.     Mouth/Throat:     Mouth: Mucous membranes are moist.  Eyes:     Extraocular Movements: Extraocular movements intact.     Conjunctiva/sclera: Conjunctivae normal.   Cardiovascular:     Rate and Rhythm: Normal rate.  Pulmonary:     Effort: Pulmonary effort is normal.     Breath sounds: Normal breath sounds.  Chest:     Chest wall: Tenderness present.  Abdominal:     General: Abdomen is flat.     Palpations: Abdomen is soft.     Tenderness: There is no abdominal tenderness.  Musculoskeletal:        General: No swelling. Normal range of motion.     Cervical back: Neck supple.  Skin:    General: Skin is warm and dry.  Neurological:     General: No focal deficit present.     Mental Status: She is alert.  Psychiatric:        Mood and Affect: Mood normal.     ED Results / Procedures / Treatments   EKG EKG Interpretation Date/Time:  Monday September 03 2024 21:25:21 EDT Ventricular Rate:  82 PR Interval:  162 QRS Duration:  88 QT Interval:  368 QTC Calculation: 429 R Axis:   76  Text Interpretation: Normal sinus rhythm Normal ECG When compared with ECG of 01-Jul-2024 10:28, PREVIOUS ECG IS PRESENT No significant change since last tracing Confirmed by Roselyn Dunnings [phone]) on 09/03/2024 11:01:23 PM  Procedures Procedures  Medications Ordered in the ED  Medications - No data to display  Initial Impression and Plan  Patient here with nonspecific CP and SOB. Vitals are reassuring. Recent CTA was neg for PE just a few days ago and she reports overall compliance with AC, no concern for new PE. Labs done in triage show unremarkable CBC, BMP and Trop. I personally viewed the images from radiology studies and agree with radiologist interpretation: ***CXR is clear. Will monitor for repeat Trop.   ED Course       MDM Rules/Calculators/A&P Medical Decision Making Amount and/or Complexity of Data Reviewed Labs: ordered. Radiology: ordered.     Final Clinical Impression(s) / ED Diagnoses Final diagnoses:  None    Rx / DC Orders ED Discharge Orders     None

## 2024-09-10 ENCOUNTER — Other Ambulatory Visit: Payer: Self-pay | Admitting: *Deleted

## 2024-09-10 ENCOUNTER — Other Ambulatory Visit: Payer: Self-pay

## 2024-09-10 DIAGNOSIS — Z7901 Long term (current) use of anticoagulants: Secondary | ICD-10-CM

## 2024-09-10 DIAGNOSIS — Z8709 Personal history of other diseases of the respiratory system: Secondary | ICD-10-CM

## 2024-09-10 DIAGNOSIS — Z86711 Personal history of pulmonary embolism: Secondary | ICD-10-CM

## 2024-09-10 DIAGNOSIS — R0602 Shortness of breath: Secondary | ICD-10-CM

## 2024-09-10 DIAGNOSIS — R7689 Other specified abnormal immunological findings in serum: Secondary | ICD-10-CM

## 2024-09-10 DIAGNOSIS — R590 Localized enlarged lymph nodes: Secondary | ICD-10-CM

## 2024-09-11 ENCOUNTER — Ambulatory Visit: Admitting: Neurology

## 2024-09-11 ENCOUNTER — Ambulatory Visit (INDEPENDENT_AMBULATORY_CARE_PROVIDER_SITE_OTHER): Admitting: *Deleted

## 2024-09-11 DIAGNOSIS — Z7901 Long term (current) use of anticoagulants: Secondary | ICD-10-CM

## 2024-09-11 DIAGNOSIS — R590 Localized enlarged lymph nodes: Secondary | ICD-10-CM

## 2024-09-11 DIAGNOSIS — Z8709 Personal history of other diseases of the respiratory system: Secondary | ICD-10-CM

## 2024-09-11 DIAGNOSIS — Z6841 Body Mass Index (BMI) 40.0 and over, adult: Secondary | ICD-10-CM

## 2024-09-11 DIAGNOSIS — R0602 Shortness of breath: Secondary | ICD-10-CM

## 2024-09-11 DIAGNOSIS — R918 Other nonspecific abnormal finding of lung field: Secondary | ICD-10-CM

## 2024-09-11 DIAGNOSIS — Z86711 Personal history of pulmonary embolism: Secondary | ICD-10-CM

## 2024-09-11 LAB — PULMONARY FUNCTION TEST
DL/VA % pred: 114 %
DL/VA: 5.14 ml/min/mmHg/L
DLCO cor % pred: 101 %
DLCO cor: 24.32 ml/min/mmHg
DLCO unc % pred: 100 %
DLCO unc: 23.94 ml/min/mmHg
FEF 25-75 Post: 3.81 L/s
FEF 25-75 Pre: 2.76 L/s
FEF2575-%Change-Post: 38 %
FEF2575-%Pred-Post: 108 %
FEF2575-%Pred-Pre: 78 %
FEV1-%Change-Post: 8 %
FEV1-%Pred-Post: 91 %
FEV1-%Pred-Pre: 84 %
FEV1-Post: 3.06 L
FEV1-Pre: 2.82 L
FEV1FVC-%Change-Post: 6 %
FEV1FVC-%Pred-Pre: 95 %
FEV6-%Change-Post: 1 %
FEV6-%Pred-Post: 90 %
FEV6-%Pred-Pre: 88 %
FEV6-Post: 3.58 L
FEV6-Pre: 3.53 L
FEV6FVC-%Change-Post: 0 %
FEV6FVC-%Pred-Post: 101 %
FEV6FVC-%Pred-Pre: 101 %
FVC-%Change-Post: 1 %
FVC-%Pred-Post: 89 %
FVC-%Pred-Pre: 87 %
FVC-Post: 3.58 L
FVC-Pre: 3.53 L
Post FEV1/FVC ratio: 85 %
Post FEV6/FVC ratio: 100 %
Pre FEV1/FVC ratio: 80 %
Pre FEV6/FVC Ratio: 100 %
RV % pred: 107 %
RV: 1.63 L
TLC % pred: 98 %
TLC: 5.29 L

## 2024-09-11 LAB — CBC WITH DIFFERENTIAL/PLATELET
Basophils Absolute: 0.1 x10E3/uL (ref 0.0–0.2)
Basos: 1 %
EOS (ABSOLUTE): 0.7 x10E3/uL — ABNORMAL HIGH (ref 0.0–0.4)
Eos: 6 %
Hematocrit: 43.1 % (ref 34.0–46.6)
Hemoglobin: 13.3 g/dL (ref 11.1–15.9)
Immature Grans (Abs): 0.1 x10E3/uL (ref 0.0–0.1)
Immature Granulocytes: 1 %
Lymphocytes Absolute: 3.5 x10E3/uL — ABNORMAL HIGH (ref 0.7–3.1)
Lymphs: 30 %
MCH: 27.2 pg (ref 26.6–33.0)
MCHC: 30.9 g/dL — ABNORMAL LOW (ref 31.5–35.7)
MCV: 88 fL (ref 79–97)
Monocytes Absolute: 0.4 x10E3/uL (ref 0.1–0.9)
Monocytes: 4 %
Neutrophils Absolute: 6.9 x10E3/uL (ref 1.4–7.0)
Neutrophils: 58 %
Platelets: 378 x10E3/uL (ref 150–450)
RBC: 4.89 x10E6/uL (ref 3.77–5.28)
RDW: 13.1 % (ref 11.7–15.4)
WBC: 11.7 x10E3/uL — ABNORMAL HIGH (ref 3.4–10.8)

## 2024-09-11 NOTE — Patient Instructions (Signed)
 Full PFT performed today.

## 2024-09-11 NOTE — Progress Notes (Signed)
 Full PFT performed today.

## 2024-09-12 LAB — RHEUMATOID FACTOR: Rheumatoid fact SerPl-aCnc: 28.9 [IU]/mL — ABNORMAL HIGH (ref <14.0)

## 2024-09-12 LAB — ANTI-DNA ANTIBODY, DOUBLE-STRANDED: dsDNA Ab: 1 [IU]/mL (ref 0–9)

## 2024-09-12 LAB — CK: Total CK: 23 U/L — ABNORMAL LOW (ref 32–182)

## 2024-09-12 LAB — BASIC METABOLIC PANEL WITH GFR
BUN/Creatinine Ratio: 11 (ref 9–23)
BUN: 10 mg/dL (ref 6–20)
CO2: 23 mmol/L (ref 20–29)
Calcium: 11.2 mg/dL — ABNORMAL HIGH (ref 8.7–10.2)
Chloride: 104 mmol/L (ref 96–106)
Creatinine, Ser: 0.9 mg/dL (ref 0.57–1.00)
Glucose: 96 mg/dL (ref 70–99)
Potassium: 4.2 mmol/L (ref 3.5–5.2)
Sodium: 143 mmol/L (ref 134–144)
eGFR: 87 mL/min/1.73 (ref >59)

## 2024-09-12 LAB — ANA: Anti Nuclear Antibody (ANA): NEGATIVE

## 2024-09-14 LAB — IGE: IgE (Immunoglobulin E), Serum: 63 [IU]/mL (ref 6–495)

## 2024-09-15 NOTE — Addendum Note (Signed)
 Addended by: Myrical Andujo on: 09/15/2024 03:57 PM   Modules accepted: Orders

## 2024-09-15 NOTE — Progress Notes (Signed)
 RF elevated. Updated pt over the phone. Will refer to rheumatology. Calcium mildly elevated- previously high normal as well. Pt reports she doesn't take calcium supplements. Advised her to take more oral fluids. Will repeat labs in future.

## 2024-09-24 ENCOUNTER — Ambulatory Visit

## 2024-09-24 VITALS — BP 114/80 | HR 95 | Temp 97.7°F | Ht 66.0 in | Wt 246.0 lb

## 2024-09-24 DIAGNOSIS — R7689 Other specified abnormal immunological findings in serum: Secondary | ICD-10-CM | POA: Diagnosis not present

## 2024-09-24 DIAGNOSIS — R918 Other nonspecific abnormal finding of lung field: Secondary | ICD-10-CM

## 2024-09-24 DIAGNOSIS — J4520 Mild intermittent asthma, uncomplicated: Secondary | ICD-10-CM

## 2024-09-24 DIAGNOSIS — I2694 Multiple subsegmental pulmonary emboli without acute cor pulmonale: Secondary | ICD-10-CM | POA: Diagnosis not present

## 2024-09-24 MED ORDER — BUDESONIDE-FORMOTEROL FUMARATE 80-4.5 MCG/ACT IN AERO: 2.0000 | INHALATION_SPRAY | Freq: Two times a day (BID) | RESPIRATORY_TRACT | 12 refills | Status: AC | PRN

## 2024-09-24 NOTE — Assessment & Plan Note (Addendum)
 Unclear if this was provoked or unprovoked Seems to have some underlying inflammatory disorder Pt wants to know if she has genetic tendencies to form blood clots, will refer to hematology She wants to lower the dose to 2.5 mg BID as she has completed 3 months of eliquis  Orders:   Ambulatory referral to Endocrinology   Ambulatory referral to Hematology / Oncology

## 2024-09-24 NOTE — Patient Instructions (Addendum)
 It was a pleasure to see you today. Referral sent to hematology/oncology and endocrinologist.  Symbicort - use it as twice a day as needed Please rinse your mouth after inhaler use.

## 2024-09-24 NOTE — Progress Notes (Signed)
 New Patient Pulmonology Office Visit   INITIAL HPI 08/29/2024  Patient ID: Becky Gomez, female    DOB: 10-23-1991  MRN: [id number]  Referred by: Trudy Dorn BRAVO, MD  CC:  Chief Complaint  Patient presents with   Shortness of Breath    Patient had a bad episode last week ( headaches and dizziness). Pft result.    Shortness of Breath   Becky Gomez is a 33 y.o. female who is referred to this clinic for multiple PE.  Patient was admitted to the hospital at the beginning of August this year for multiple syncopal episodes, ongoing shortness of breath, chest tightness.  Patient reports he had those symptoms for almost 3 weeks.  On the third week, she had 3-4 syncopal episodes, also had significant shortness of breath at rest and with exertion and chest tightness.  She was previously diagnosed with pregnancy related asthma.  She also reports a strong history of asthma in her biological family.  She believes she had ongoing bronchitis/viral infection, however as symptoms progressed and worsened she presented to ED, where she was found to have elevated troponin and small subsegmental PE.  She has been on Eliquis  since then Factor V Leiden was negative She was not on contraceptive pills during this time. Denies prior history of blood clots  Also during the same hospital admissions, she developed progressively worsening right sided weakness and numbness and underwent stroke evaluation.  Her symptoms progressed after discharge and she was seen by neurology outpatient.  More recently she had seen [facility] neurology.  As per their note, discharge note from 07/19/2024, she was diagnosed with functional neurological disorders.  Patient reports she received steroid twice and steroids seem to help her symptoms some  She reports her weakness has improved but not resolved completely.  She is following with physical therapy closely  She still reports shortness of breath, worse with  sneezing Prior lab work during inpatient stay has shown high eosinophils  She was adopted when she was 32 years old.  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  She lives with her husband, who she reports is very supportive of her    Subjective:   10/27- completed CTPE, labs, PFT. Here to review results Following [facility] with [facility] and plan is to start headache/migraine medications Last week she had shortness of breath, vision changes and felt like syncope again She is also wanting to come off eliquis  or to reduce the dose and it causes her to have heavy periods Reports she is able to walk more now and has gradual improvement on right side weakness   Review of symptoms negative except mentioned above  Allergies: Penicillins and Pineapple  Current Outpatient Medications:    apixaban  (ELIQUIS ) 5 MG TABS tablet, Take 1 tablet (5 mg total) by mouth 2 (two) times daily., Disp: 60 tablet, Rfl: 3   budesonide-formoterol (SYMBICORT) 80-4.5 MCG/ACT inhaler, Inhale 2 puffs into the lungs 2 (two) times daily as needed., Disp: 1 each, Rfl: 12   gabapentin  (NEURONTIN ) 100 MG capsule, Take 1 capsule (100 mg total) by mouth 3 (three) times daily for 10 days., Disp: 90 capsule, Rfl: 2 Past Medical History:  Diagnosis Date   Asthma    Bronchitis    Depression    Headache  Hypoglycemia    Past Surgical History:  Procedure Laterality Date   CHOLECYSTECTOMY     History reviewed. No pertinent family history. Social History   Socioeconomic History   Marital status: Married    Spouse name: Not on file   Number of children: Not on file   Years of education: Not on file   Highest education level: Not on file  Occupational History   Not on file  Tobacco Use   Smoking status: Former    Types: Cigarettes    Smokeless tobacco: Former   Tobacco comments:    Patient smoked for 2 years as a teenager. Quit   Substance and Sexual Activity   Alcohol use: No   Drug use: No   Sexual activity: Yes  Other Topics Concern   Not on file  Social History Narrative   Not on file   Social Drivers of Health   Financial Resource Strain: Not on file  Food Insecurity: No Food Insecurity (07/01/2024)   Hunger Vital Sign    Worried About Running Out of Food in the Last Year: Never true    Ran Out of Food in the Last Year: Never true  Transportation Needs: No Transportation Needs (07/01/2024)   PRAPARE - Administrator, Civil Service (Medical): No    Lack of Transportation (Non-Medical): No  Physical Activity: Not on file  Stress: Not on file  Social Connections: Unknown (04/02/2022)   Received from [facility]   Social Network    Social Network: Not on file  Intimate Partner Violence: Not At Risk (07/07/2024)   Received from [facility]   Humiliation, Afraid, Rape, and Kick questionnaire    Within the last year, have you been afraid of your partner or ex-partner?: No    Within the last year, have you been humiliated or emotionally abused in other ways by your partner or ex-partner?: No    Within the last year, have you been kicked, hit, slapped, or otherwise physically hurt by your partner or ex-partner?: No    Within the last year, have you been raped or forced to have any kind of sexual activity by your partner or ex-partner?: No         Objective:  BP 114/80   Pulse 95   Temp 97.7 F (36.5 C) (Oral)   Ht 5' 6 (1.676 m)   Wt 246 lb (111.6 kg)   LMP 08/25/2024   SpO2 96%   BMI 39.71 kg/m    Physical Exam Constitutional:      General: She is not in acute distress.    Appearance: Normal appearance.  HENT:     Mouth/Throat:     Mouth: Mucous membranes are moist.  Cardiovascular:     Rate and Rhythm: Normal rate.  Pulmonary:     Effort: No respiratory distress.     Breath  sounds: No wheezing or rales.  Musculoskeletal:     Right lower leg: No edema.     Left lower leg: No edema.  Skin:    General: Skin is warm.  Neurological:     Mental Status: She is alert and oriented to person, place, and time.  Psychiatric:        Mood and Affect: Mood normal.     Diagnostic Review:    Pft    Latest Ref Rng & Units 09/11/2024    3:33 PM  PFT Results  FVC-Pre L 3.53  P  FVC-Predicted Pre % 87  P  FVC-Post L 3.58  P  FVC-Predicted Post % 89  P  Pre FEV1/FVC % % 80  P  Post FEV1/FCV % % 85  P  FEV1-Pre L 2.82  P  FEV1-Predicted Pre % 84  P  FEV1-Post L 3.06  P  DLCO uncorrected ml/min/mmHg 23.94  P  DLCO UNC% % 100  P  DLCO corrected ml/min/mmHg 24.32  P  DLCO COR %Predicted % 101  P  DLVA Predicted % 114  P  TLC L 5.29  P  TLC % Predicted % 98  P  RV % Predicted % 107  P    P Preliminary result  *8% change post BD   CT 06/2024 1. Evidence of 2 small subsegmental emboli over the right lower lobar pulmonary arteries. 2. Nodular airspace process over the left lower lobe with the largest focus measuring 2.3 cm. Findings may be due to an infectious or inflammatory process. Recommend follow-up CT 4-6 weeks. 3. Posterior bibasilar dependent opacification likely atelectasis, although could not exclude infection. 4. 1.1 cm right hilar lymph node likely reactive.   MRI head 06/2024 Normal brain MRI. No acute abnormality.       Assessment & Plan:   33 year old unfortunate female who developed multiple episodes of syncope and was found to have subsegmental PE in 06/2024.  Had right-sided weakness since then, gradually improving. Diagnosed with  functional neurological disorders and follows with [facility]  Assessment & Plan Multiple subsegmental pulmonary emboli without acute cor pulmonale (HCC) Unclear if this was provoked or unprovoked Seems to have some underlying inflammatory disorder Pt wants to know if she has genetic tendencies to form  blood clots, will refer to hematology She wants to lower the dose to 2.5 mg BID as she has completed 3 months of eliquis  Orders:   Ambulatory referral to Endocrinology   Ambulatory referral to Hematology / Oncology  Hypercalcemia Has had elevated calcium on several labs and seems to be increasing on recent labs (10.5->10.8->11.2) Pt not on vit D or calcium supplements CT chest is not concerning for sarcoidosis Orders:   Ambulatory referral to Endocrinology   Ambulatory referral to Hematology / Oncology  Lung nodules CT had shown more than 2 cm left sided infiltrate/nodular density.  It had also shown hilar lymphadenopathy- these have resolved on short interval However it showed persistence of 6mm nodules in left lower lobe  Will set up follow up CT chest in about 4 months from now Orders:   Ambulatory referral to Endocrinology   CT Chest Wo Contrast; Future  Positive ANA (antinuclear antibody) Referred to rheumatology Has appointment in feb/2025    Mild intermittent asthma, unspecified whether complicated Discussed PFT results with the pt, positive but below 10% BD response Had high eosinophils in the past She will try empiric ICS/LABA and stop if there is no difference      Return in about 4 months (around 01/17/2025).   Delorean Knutzen Pleas, MD [facility] Office: [phone]  I personally spent a total of 45 minutes in the care of the patient today including performing a medically appropriate exam/evaluation, counseling and educating, placing orders, referring and communicating with other health care professionals, documenting clinical information in the EHR, and independently interpreting results.

## 2024-09-24 NOTE — Assessment & Plan Note (Addendum)
 Has had elevated calcium on several labs and seems to be increasing on recent labs (10.5->10.8->11.2) Pt not on vit D or calcium supplements CT chest is not concerning for sarcoidosis Orders:   Ambulatory referral to Endocrinology   Ambulatory referral to Hematology / Oncology

## 2024-10-24 ENCOUNTER — Ambulatory Visit

## 2024-10-24 ENCOUNTER — Encounter

## 2024-11-12 ENCOUNTER — Telehealth: Payer: Self-pay | Admitting: Oncology

## 2024-11-12 NOTE — Telephone Encounter (Signed)
 PT called to cancel appts, son has cancer and can't come in at the moment.

## 2024-11-13 ENCOUNTER — Inpatient Hospital Stay

## 2024-11-13 ENCOUNTER — Inpatient Hospital Stay: Admitting: Oncology

## 2024-12-18 NOTE — Progress Notes (Unsigned)
 "  Office Visit Note  Patient: Becky Gomez             Date of Birth: [birth year]           MRN: [id number]             PCP: Trudy Dorn BRAVO, MD Referring: Pleas Newborn, MD Visit Date: 01/01/2025 Occupation: Data Unavailable  Subjective:  No chief complaint on file.   History of Present Illness: Becky Gomez is a 34 y.o. female ***     Activities of Daily Living:  Patient reports morning stiffness for anywhere from 1 hour to all day.   Patient Reports nocturnal pain.  Difficulty dressing/grooming: Denies Difficulty climbing stairs: Reports Difficulty getting out of chair: Denies Difficulty using hands for taps, buttons, cutlery, and/or writing: Reports  Review of Systems  Constitutional:  Positive for fatigue.  HENT:  Positive for mouth sores and mouth dryness.   Eyes:  Negative for dryness.  Respiratory:  Positive for shortness of breath.   Cardiovascular:  Negative for chest pain and palpitations.  Gastrointestinal:  Positive for diarrhea. Negative for blood in stool and constipation.  Endocrine: Positive for increased urination.  Genitourinary:  Negative for involuntary urination.  Musculoskeletal:  Positive for joint pain, gait problem, joint pain, joint swelling, myalgias, muscle weakness, morning stiffness, muscle tenderness and myalgias.  Skin:  Positive for rash and hair loss. Negative for color change and sensitivity to sunlight.  Allergic/Immunologic: Negative for susceptible to infections.  Neurological:  Positive for dizziness and headaches.  Hematological:  Negative for swollen glands.  Psychiatric/Behavioral:  Positive for depressed mood and sleep disturbance. The patient is nervous/anxious.     PMFS History:  Patient Active Problem List   Diagnosis Date Noted   Acute right-sided weakness 07/17/2024   Paresthesia 07/17/2024   Weakness of right upper extremity 07/03/2024   Pulmonary embolus (HCC) 07/01/2024   Elevated troponin 07/01/2024    Hypercalcemia 07/01/2024   Leukocytosis 07/01/2024   Anxiety and depression 07/01/2024   Metallic taste 07/01/2024   Snoring 07/01/2024    Past Medical History:  Diagnosis Date   Asthma    Bronchitis    Depression    Headache    Hypoglycemia     No family history on file. Past Surgical History:  Procedure Laterality Date   CHOLECYSTECTOMY     Social History[1] Social History   Social History Narrative   Not on file      There is no immunization history on file for this patient.   Objective: Vital Signs: There were no vitals taken for this visit.   Physical Exam   Musculoskeletal Exam: ***  CDAI Exam: CDAI Score: -- Patient Global: --; Provider Global: -- Swollen: --; Tender: -- Joint Exam 01/01/2025   No joint exam has been documented for this visit   There is currently no information documented on the homunculus. Go to the Rheumatology activity and complete the homunculus joint exam.  Investigation: No additional findings.  Imaging: No results found.  Recent Labs: Lab Results  Component Value Date   WBC 11.7 (H) 09/10/2024   HGB 13.3 09/10/2024   PLT 378 09/10/2024   NA 143 09/10/2024   K 4.2 09/10/2024   CL 104 09/10/2024   CO2 23 09/10/2024   GLUCOSE 96 09/10/2024   BUN 10 09/10/2024   CREATININE 0.90 09/10/2024   BILITOT 0.6 07/01/2024   ALKPHOS 90 07/01/2024   AST 25 07/01/2024   ALT 33 07/01/2024  PROT 7.1 07/01/2024   ALBUMIN 3.8 07/01/2024   CALCIUM 11.2 (H) 09/10/2024    Speciality Comments: No specialty comments available.  Procedures:  No procedures performed Allergies: Penicillins and Pineapple   Assessment / Plan:     Visit Diagnoses: No diagnosis found.  Orders: No orders of the defined types were placed in this encounter.  No orders of the defined types were placed in this encounter.   Face-to-face time spent with patient was *** minutes. Greater than 50% of time was spent in counseling and coordination of  care.  Follow-Up Instructions: No follow-ups on file.   Alfonso Patterson, LPN  Note - This record has been created using [employer].  Chart creation errors have been sought, but may not always  have been located. Such creation errors do not reflect on  the standard of medical care.     [1]  Social History Tobacco Use   Smoking status: Former    Types: Cigarettes   Smokeless tobacco: Former   Tobacco comments:    Patient smoked for 2 years as a teenager. Quit   Substance Use Topics   Alcohol use: No   Drug use: No   "

## 2024-12-19 NOTE — Telephone Encounter (Signed)
 Please advise.

## 2024-12-20 NOTE — Telephone Encounter (Signed)
 Please find out which breathing treatment she is referring to. She should already have symbicort  inhaler for twice daily use.  Okay to prescribe a nebulizer machine and duonebs if that's what she is asking for.

## 2024-12-31 ENCOUNTER — Telehealth (HOSPITAL_BASED_OUTPATIENT_CLINIC_OR_DEPARTMENT_OTHER): Payer: Self-pay

## 2024-12-31 NOTE — Telephone Encounter (Signed)
 Pt notified CT ordered is ordered WO contrast    Copied from CRM #[id number]. Topic: Clinical - Medical Advice >> Dec 31, 2024 11:17 AM Becky Gomez wrote: Reason for CRM: Patient has Gomez CT scan scheduled for 01/04/25.  She has had several CT scans with the last one being on 11/28/24.  She would like to know if Dr. Pleas can use that one.  If not she will go ahead and do it but the contrast dye makes her sick because her body doesn't process it very well.  Please return her call to let her know at [phone].  Thanks.

## 2025-01-01 ENCOUNTER — Ambulatory Visit

## 2025-01-01 VITALS — BP 117/87 | HR 91 | Temp 98.3°F | Resp 14 | Ht 60.75 in | Wt 246.4 lb

## 2025-01-01 DIAGNOSIS — I2699 Other pulmonary embolism without acute cor pulmonale: Secondary | ICD-10-CM

## 2025-01-01 DIAGNOSIS — B999 Unspecified infectious disease: Secondary | ICD-10-CM

## 2025-01-01 DIAGNOSIS — R7689 Other specified abnormal immunological findings in serum: Secondary | ICD-10-CM

## 2025-01-01 DIAGNOSIS — K1379 Other lesions of oral mucosa: Secondary | ICD-10-CM | POA: Diagnosis not present

## 2025-01-01 DIAGNOSIS — Z862 Personal history of diseases of the blood and blood-forming organs and certain disorders involving the immune mechanism: Secondary | ICD-10-CM | POA: Diagnosis not present

## 2025-01-01 DIAGNOSIS — R21 Rash and other nonspecific skin eruption: Secondary | ICD-10-CM

## 2025-01-01 DIAGNOSIS — F444 Conversion disorder with motor symptom or deficit: Secondary | ICD-10-CM

## 2025-01-04 ENCOUNTER — Ambulatory Visit: Admission: RE | Admit: 2025-01-04 | Source: Ambulatory Visit

## 2025-01-04 DIAGNOSIS — R918 Other nonspecific abnormal finding of lung field: Secondary | ICD-10-CM

## 2025-01-04 LAB — LUPUS ANTICOAGULANT EVAL W/ REFLEX
PTT-LA Screen: 36 s
dRVVT: 42 s

## 2025-01-04 LAB — CARDIOLIPIN ANTIBODIES, IGG, IGM, IGA
Anticardiolipin IgA: <2 [APL'U]/mL
Anticardiolipin IgG: <2 [GPL'U]/mL
Anticardiolipin IgM: <2 [MPL'U]/mL

## 2025-01-04 LAB — ANGIOTENSIN CONVERTING ENZYME: Angiotensin-Converting Enzyme: 24 U/L (ref 9–67)

## 2025-01-04 LAB — IGG, IGA, IGM
IgG (Immunoglobin G), Serum: 773 mg/dL (ref 600–1640)
IgM, Serum: 153 mg/dL (ref 50–300)
Immunoglobulin A: 178 mg/dL (ref 47–310)

## 2025-01-04 LAB — BETA-2 GLYCOPROTEIN ANTIBODIES
Beta-2 Glyco 1 IgA: <2 U/mL
Beta-2 Glyco 1 IgM: <2 U/mL
Beta-2 Glyco I IgG: <2 U/mL

## 2025-01-07 ENCOUNTER — Ambulatory Visit

## 2025-02-05 ENCOUNTER — Ambulatory Visit
